# Patient Record
Sex: Female | Born: 1960 | Race: Black or African American | Hispanic: No | Smoking: Never smoker
Health system: Southern US, Community
[De-identification: ages and names within clinical notes are randomized; demographics above are authoritative.]

## PROBLEM LIST (undated history)

## (undated) DIAGNOSIS — I10 Essential (primary) hypertension: Secondary | ICD-10-CM

## (undated) DIAGNOSIS — E119 Type 2 diabetes mellitus without complications: Secondary | ICD-10-CM

## (undated) HISTORY — PX: OOPHORECTOMY: SHX86

---

## 2004-09-29 ENCOUNTER — Ambulatory Visit: Payer: Self-pay | Admitting: Internal Medicine

## 2005-07-28 ENCOUNTER — Emergency Department: Payer: Self-pay | Admitting: Emergency Medicine

## 2005-07-28 ENCOUNTER — Other Ambulatory Visit: Payer: Self-pay

## 2007-09-16 ENCOUNTER — Emergency Department: Payer: Self-pay | Admitting: Emergency Medicine

## 2009-09-25 ENCOUNTER — Ambulatory Visit: Payer: Self-pay

## 2010-01-06 ENCOUNTER — Ambulatory Visit: Payer: Self-pay | Admitting: Family Medicine

## 2011-06-30 ENCOUNTER — Emergency Department: Payer: Self-pay | Admitting: Emergency Medicine

## 2012-01-20 ENCOUNTER — Emergency Department: Payer: Self-pay | Admitting: Emergency Medicine

## 2012-01-20 LAB — COMPREHENSIVE METABOLIC PANEL
Albumin: 3.7 g/dL (ref 3.4–5.0)
Alkaline Phosphatase: 72 U/L (ref 50–136)
BUN: 13 mg/dL (ref 7–18)
Bilirubin,Total: 0.6 mg/dL (ref 0.2–1.0)
Calcium, Total: 9.3 mg/dL (ref 8.5–10.1)
Chloride: 102 mmol/L (ref 98–107)
Co2: 28 mmol/L (ref 21–32)
Creatinine: 0.78 mg/dL (ref 0.60–1.30)
EGFR (African American): >60
EGFR (Non-African Amer.): >60
Glucose: 369 mg/dL — ABNORMAL HIGH (ref 65–99)
Osmolality: 287 (ref 275–301)
SGPT (ALT): 19 U/L
Sodium: 136 mmol/L (ref 136–145)
Total Protein: 7.7 g/dL (ref 6.4–8.2)

## 2012-01-20 LAB — CBC
HGB: 10.8 g/dL — ABNORMAL LOW (ref 12.0–16.0)
MCV: 85 fL (ref 80–100)
Platelet: 236 10*3/uL (ref 150–440)
RBC: 3.89 10*6/uL (ref 3.80–5.20)
WBC: 5.9 10*3/uL (ref 3.6–11.0)

## 2012-11-13 ENCOUNTER — Emergency Department: Payer: Self-pay | Admitting: Internal Medicine

## 2012-11-13 LAB — CBC
HCT: 35.7 % (ref 35.0–47.0)
MCV: 80 fL (ref 80–100)
Platelet: 323 10*3/uL (ref 150–440)
RBC: 4.49 10*6/uL (ref 3.80–5.20)
WBC: 8 10*3/uL (ref 3.6–11.0)

## 2012-11-13 LAB — BASIC METABOLIC PANEL
Anion Gap: 9 (ref 7–16)
BUN: 6 mg/dL — ABNORMAL LOW (ref 7–18)
Co2: 26 mmol/L (ref 21–32)
Creatinine: 0.58 mg/dL — ABNORMAL LOW (ref 0.60–1.30)
EGFR (African American): >60
EGFR (Non-African Amer.): >60
Glucose: 263 mg/dL — ABNORMAL HIGH (ref 65–99)
Potassium: 3.6 mmol/L (ref 3.5–5.1)
Sodium: 137 mmol/L (ref 136–145)

## 2015-02-09 ENCOUNTER — Ambulatory Visit: Payer: Self-pay | Admitting: Family Medicine

## 2015-09-23 ENCOUNTER — Emergency Department: Payer: BC Managed Care – PPO

## 2015-09-23 ENCOUNTER — Encounter: Payer: Self-pay | Admitting: Radiology

## 2015-09-23 ENCOUNTER — Observation Stay
Admission: EM | Admit: 2015-09-23 | Discharge: 2015-09-24 | Disposition: A | Discharge status: Home / Self Care | Payer: BC Managed Care – PPO | Attending: Internal Medicine | Admitting: Internal Medicine

## 2015-09-23 DIAGNOSIS — I071 Rheumatic tricuspid insufficiency: Secondary | ICD-10-CM | POA: Insufficient documentation

## 2015-09-23 DIAGNOSIS — R200 Anesthesia of skin: Secondary | ICD-10-CM | POA: Diagnosis not present

## 2015-09-23 DIAGNOSIS — E669 Obesity, unspecified: Secondary | ICD-10-CM | POA: Insufficient documentation

## 2015-09-23 DIAGNOSIS — Z6829 Body mass index (BMI) 29.0-29.9, adult: Secondary | ICD-10-CM | POA: Diagnosis not present

## 2015-09-23 DIAGNOSIS — R202 Paresthesia of skin: Secondary | ICD-10-CM | POA: Insufficient documentation

## 2015-09-23 DIAGNOSIS — Z79899 Other long term (current) drug therapy: Secondary | ICD-10-CM | POA: Insufficient documentation

## 2015-09-23 DIAGNOSIS — R51 Headache: Secondary | ICD-10-CM | POA: Insufficient documentation

## 2015-09-23 DIAGNOSIS — M4802 Spinal stenosis, cervical region: Secondary | ICD-10-CM | POA: Diagnosis not present

## 2015-09-23 DIAGNOSIS — R29898 Other symptoms and signs involving the musculoskeletal system: Secondary | ICD-10-CM

## 2015-09-23 DIAGNOSIS — R531 Weakness: Secondary | ICD-10-CM | POA: Insufficient documentation

## 2015-09-23 DIAGNOSIS — Z8249 Family history of ischemic heart disease and other diseases of the circulatory system: Secondary | ICD-10-CM | POA: Insufficient documentation

## 2015-09-23 DIAGNOSIS — M5412 Radiculopathy, cervical region: Secondary | ICD-10-CM | POA: Diagnosis not present

## 2015-09-23 DIAGNOSIS — Z7984 Long term (current) use of oral hypoglycemic drugs: Secondary | ICD-10-CM | POA: Insufficient documentation

## 2015-09-23 DIAGNOSIS — I10 Essential (primary) hypertension: Secondary | ICD-10-CM | POA: Diagnosis not present

## 2015-09-23 DIAGNOSIS — I517 Cardiomegaly: Secondary | ICD-10-CM | POA: Diagnosis not present

## 2015-09-23 DIAGNOSIS — M2578 Osteophyte, vertebrae: Secondary | ICD-10-CM | POA: Diagnosis not present

## 2015-09-23 DIAGNOSIS — I639 Cerebral infarction, unspecified: Secondary | ICD-10-CM | POA: Diagnosis present

## 2015-09-23 DIAGNOSIS — G459 Transient cerebral ischemic attack, unspecified: Principal | ICD-10-CM | POA: Insufficient documentation

## 2015-09-23 DIAGNOSIS — E1165 Type 2 diabetes mellitus with hyperglycemia: Secondary | ICD-10-CM | POA: Insufficient documentation

## 2015-09-23 DIAGNOSIS — Z8673 Personal history of transient ischemic attack (TIA), and cerebral infarction without residual deficits: Secondary | ICD-10-CM | POA: Insufficient documentation

## 2015-09-23 DIAGNOSIS — I340 Nonrheumatic mitral (valve) insufficiency: Secondary | ICD-10-CM | POA: Insufficient documentation

## 2015-09-23 HISTORY — DX: Essential (primary) hypertension: I10

## 2015-09-23 HISTORY — DX: Type 2 diabetes mellitus without complications: E11.9

## 2015-09-23 LAB — BASIC METABOLIC PANEL
Anion gap: 10 (ref 5–15)
BUN: 11 mg/dL (ref 6–20)
CALCIUM: 9.1 mg/dL (ref 8.9–10.3)
CO2: 27 mmol/L (ref 22–32)
Chloride: 98 mmol/L — ABNORMAL LOW (ref 101–111)
Creatinine, Ser: 0.72 mg/dL (ref 0.44–1.00)
GFR calc Af Amer: >60 mL/min (ref >60)
Glucose, Bld: 325 mg/dL — ABNORMAL HIGH (ref 65–99)
POTASSIUM: 3.4 mmol/L — AB (ref 3.5–5.1)
SODIUM: 135 mmol/L (ref 135–145)

## 2015-09-23 LAB — URINALYSIS COMPLETE WITH MICROSCOPIC (ARMC ONLY)
BILIRUBIN URINE: NEGATIVE
Bacteria, UA: NONE SEEN
Glucose, UA: >500 mg/dL — AB
HGB URINE DIPSTICK: NEGATIVE
LEUKOCYTES UA: NEGATIVE
NITRITE: NEGATIVE
PH: 6 (ref 5.0–8.0)
Protein, ur: NEGATIVE mg/dL
SPECIFIC GRAVITY, URINE: 1.036 — AB (ref 1.005–1.030)

## 2015-09-23 LAB — CBC
HEMATOCRIT: 36.5 % (ref 35.0–47.0)
Hemoglobin: 12 g/dL (ref 12.0–16.0)
MCH: 29.5 pg (ref 26.0–34.0)
MCHC: 32.9 g/dL (ref 32.0–36.0)
MCV: 89.6 fL (ref 80.0–100.0)
PLATELETS: 258 10*3/uL (ref 150–440)
RBC: 4.08 MIL/uL (ref 3.80–5.20)
RDW: 13.2 % (ref 11.5–14.5)
WBC: 6.8 10*3/uL (ref 3.6–11.0)

## 2015-09-23 LAB — LIPID PANEL
Cholesterol: 170 mg/dL (ref 0–200)
HDL: 47 mg/dL (ref >40)
LDL CALC: 109 mg/dL — AB (ref 0–99)
Total CHOL/HDL Ratio: 3.6 RATIO
Triglycerides: 70 mg/dL (ref <150)
VLDL: 14 mg/dL (ref 0–40)

## 2015-09-23 LAB — GLUCOSE, CAPILLARY: GLUCOSE-CAPILLARY: 241 mg/dL — AB (ref 65–99)

## 2015-09-23 MED ORDER — ASPIRIN EC 81 MG PO TBEC
81.0000 mg | DELAYED_RELEASE_TABLET | Freq: Every day | ORAL | Status: DC
  Administered 2015-09-23 – 2015-09-24 (×2): 81 mg via ORAL
  Filled 2015-09-23 (×2): qty 1

## 2015-09-23 MED ORDER — INSULIN ASPART 100 UNIT/ML ~~LOC~~ SOLN
0.0000 [IU] | Freq: Every day | SUBCUTANEOUS | Status: DC
  Administered 2015-09-24: 2 [IU] via SUBCUTANEOUS
  Filled 2015-09-23: qty 2

## 2015-09-23 MED ORDER — OXYCODONE HCL 5 MG PO TABS
5.0000 mg | ORAL_TABLET | ORAL | Status: DC | PRN
  Administered 2015-09-24 (×3): 5 mg via ORAL
  Filled 2015-09-23 (×3): qty 1

## 2015-09-23 MED ORDER — SODIUM CHLORIDE 0.9 % IJ SOLN
3.0000 mL | Freq: Two times a day (BID) | INTRAMUSCULAR | Status: DC
  Administered 2015-09-23: 3 mL via INTRAVENOUS

## 2015-09-23 MED ORDER — MORPHINE SULFATE (PF) 2 MG/ML IV SOLN: 2.0000 mg | INTRAVENOUS | Status: DC | PRN

## 2015-09-23 MED ORDER — SODIUM CHLORIDE 0.9 % IV SOLN
INTRAVENOUS | Status: DC
  Administered 2015-09-24 (×2): via INTRAVENOUS

## 2015-09-23 MED ORDER — INSULIN ASPART 100 UNIT/ML ~~LOC~~ SOLN
0.0000 [IU] | Freq: Three times a day (TID) | SUBCUTANEOUS | Status: DC
  Administered 2015-09-24 (×2): 5 [IU] via SUBCUTANEOUS
  Filled 2015-09-23 (×2): qty 5

## 2015-09-23 MED ORDER — ACETAMINOPHEN 650 MG RE SUPP
650.0000 mg | Freq: Four times a day (QID) | RECTAL | Status: DC | PRN
Start: 2015-09-23 — End: 2015-09-24

## 2015-09-23 MED ORDER — ACETAMINOPHEN 325 MG PO TABS
650.0000 mg | ORAL_TABLET | Freq: Four times a day (QID) | ORAL | Status: DC | PRN
  Administered 2015-09-23: 650 mg via ORAL
  Filled 2015-09-23: qty 2

## 2015-09-23 NOTE — H&P (Signed)
[facility] - [place] at [facility]   PATIENT NAME: Ashley Spears    MR#:  [id number]  DATE OF BIRTH:  04-Oct-1961   DATE OF ADMISSION:  09/23/2015  PRIMARY CARE PHYSICIAN: Dorann Ou, MD   REQUESTING/REFERRING PHYSICIAN: Shaune Pollack  CHIEF COMPLAINT:   Chief Complaint  Patient presents with  . Numbness   left arm numbness  HISTORY OF PRESENT ILLNESS:  Alleya Demeter  is a 54 y.o. female with a known history of essential hypertension, type 2 diabetes non-insulin-requiring is presenting with left upper extremity numbness. She describes her to the one day duration of left upper extremity numbness with paresthesias. She also associated headache which she describes mainly has global headache aching in quality his 4/10 intensity no worsening or relieving factors not associated with bright lights. She denies any further symptomatology however given duration of symptoms she is at present to Hospital further workup and evaluation. Upon presentation to emergency department she was noted to be hypertensive as well as had left-sided weakness.  PAST MEDICAL HISTORY:   Past Medical History  Diagnosis Date  . Hypertension   . Diabetes mellitus without complication (HCC)     PAST SURGICAL HISTORY:  History reviewed. No pertinent past surgical history.  SOCIAL HISTORY:   Social History  Substance Use Topics  . Smoking status: Never Smoker   . Smokeless tobacco: Not on file  . Alcohol Use: No    FAMILY HISTORY:   Family History  Problem Relation Age of Onset  . Hypertension Other     DRUG ALLERGIES:  No Known Allergies  REVIEW OF SYSTEMS:  REVIEW OF SYSTEMS:  CONSTITUTIONAL: Denies fevers, chills, fatigue, weakness.  EYES: Denies blurred vision, double vision, or eye pain.  EARS, NOSE, THROAT: Denies tinnitus, ear pain, hearing loss.  RESPIRATORY: denies cough, shortness of breath, wheezing  CARDIOVASCULAR: Denies chest pain, palpitations, edema.   GASTROINTESTINAL: Denies nausea, vomiting, diarrhea, abdominal pain.  GENITOURINARY: Denies dysuria, hematuria.  ENDOCRINE: Denies nocturia or thyroid problems. HEMATOLOGIC AND LYMPHATIC: Denies easy bruising or bleeding.  SKIN: Denies rash or lesions.  MUSCULOSKELETAL: Denies pain in neck, back, shoulder, knees, hips, or further arthritic symptoms.  NEUROLOGIC: Denies paralysis, positive paresthesias and headache.  PSYCHIATRIC: Denies anxiety or depressive symptoms. Otherwise full review of systems performed by me is negative.   MEDICATIONS AT HOME:   Prior to Admission medications   Medication Sig Start Date End Date Taking? Authorizing Provider  ibuprofen (ADVIL,MOTRIN) 800 MG tablet Take 800 mg by mouth every 8 (eight) hours as needed for mild pain.   Yes Historical Provider, MD  lisinopril-hydrochlorothiazide (PRINZIDE,ZESTORETIC) 20-25 MG tablet Take 1 tablet by mouth daily.   Yes Historical Provider, MD  metFORMIN (GLUCOPHAGE) 1000 MG tablet Take 1,000 mg by mouth 2 (two) times daily.   Yes Historical Provider, MD  norethindrone (AYGESTIN) 5 MG tablet Take 5 mg by mouth daily.   Yes Historical Provider, MD      VITAL SIGNS:  Blood pressure 171/97, pulse 93, temperature 98.7 F (37.1 C), temperature source Oral, resp. rate 16, last menstrual period 09/04/2015, SpO2 100 %.  PHYSICAL EXAMINATION:  VITAL SIGNS: Filed Vitals:   09/23/15 2130  BP: 171/97  Pulse: 93  Temp:   Resp: 16   GENERAL:54 y.o.female currently in no acute distress.  HEAD: Normocephalic, atraumatic.  EYES: Pupils equal, round, reactive to light. Extraocular muscles intact. No scleral icterus.  MOUTH: Moist mucosal membrane. Dentition intact. No abscess noted.  EAR, NOSE, THROAT: Clear  without exudates. No external lesions.  NECK: Supple. No thyromegaly. No nodules. No JVD.  PULMONARY: Clear to ascultation, without wheeze rails or rhonci. No use of accessory muscles, Good respiratory effort. good air  entry bilaterally CHEST: Nontender to palpation.  CARDIOVASCULAR: S1 and S2. Regular rate and rhythm. No murmurs, rubs, or gallops. No edema. Pedal pulses 2+ bilaterally.  GASTROINTESTINAL: Soft, nontender, nondistended. No masses. Positive bowel sounds. No hepatosplenomegaly.  MUSCULOSKELETAL: No swelling, clubbing, or edema. Range of motion full in all extremities.  NEUROLOGIC: Cranial nerves II through XII are intact. Sensation to light touch diminished left upper extremity remainder intact, strength 5/5 right-sided upper/lower proximal/distal flexion/extension, left handgrip 4 minus/5 left upper extremity flexion/extension proximal/distal 4 minus/5 lower extremity proximal flexion and extension 4+/5 distal flexion 4-/5 pronator drift within normal limits, gait deferred at this time positive left sided cervical radiculopathy (Spurling maneuver) SKIN: No ulceration, lesions, rashes, or cyanosis. Skin warm and dry. Turgor intact.  PSYCHIATRIC: Mood, affect within normal limits. The patient is awake, alert and oriented x 3. Insight, judgment intact.    LABORATORY PANEL:   CBC  Recent Labs Lab 09/23/15 2012  WBC 6.8  HGB 12.0  HCT 36.5  PLT 258   ------------------------------------------------------------------------------------------------------------------  Chemistries   Recent Labs Lab 09/23/15 2012  NA 135  K 3.4*  CL 98*  CO2 27  GLUCOSE 325*  BUN 11  CREATININE 0.72  CALCIUM 9.1   ------------------------------------------------------------------------------------------------------------------  Cardiac Enzymes No results for input(s): TROPONINI in the last 168 hours. ------------------------------------------------------------------------------------------------------------------  RADIOLOGY:  Ct Head Wo Contrast  09/23/2015  CLINICAL DATA:  Patient woke up yesterday morning with left arm numbness and tingling associated with headache. No resolution. Generalized  weakness. Patient was sent here from [facility] primary care. EXAM: CT HEAD WITHOUT CONTRAST TECHNIQUE: Contiguous axial images were obtained from the base of the skull through the vertex without intravenous contrast. COMPARISON:  11/13/2012 FINDINGS: The ventricles and sulci appear symmetrical. No ventricular dilatation. No mass effect or midline shift. No abnormal extra-axial fluid collections. Gray-white matter junctions are distinct. Basal cisterns are not effaced. No evidence of acute intracranial hemorrhage. No depressed skull fractures. Visualized paranasal sinuses and mastoid air cells are not opacified. IMPRESSION: No acute intracranial abnormalities. Electronically Signed   By: Burman NievesWilliam  Stevens M.D.   On: 09/23/2015 20:08    EKG:   Orders placed or performed during the hospital encounter of 09/23/15  . ED EKG  . ED EKG    IMPRESSION AND PLAN:   54 year old African-American female history of essential hypertension is presenting with left upper extremity numbness  1. CVA: Given exam concerning for stroke initiate aspirin statin therapy check MRI, placed in telemetry, check lipids, hemoglobin A1c neuro checks, permissive hypertension. Given cervical radiculopathy will also get MRI cervical spine 2. Essential hypertension: Permissive hypertension 3. Type 2 diabetes not as requiring: Hold oral agents at insulin sliding scale with Accu-Cheks 4. Venous thromboembolism prophylactic: SCDs first 24 hours can transition to heparin after that    All the records are reviewed and case discussed with ED provider. Management plans discussed with the patient, family and they are in agreement.  CODE STATUS: Full  TOTAL TIME TAKING CARE OF THIS PATIENT: 45 minutes.    Amilliana Hayworth,  Mardi MainlandDavid K M.D on 09/23/2015 at 10:03 PM  Between 7am to 6pm - Pager - [phone]  After 6pm: House Pager: - [phone]  [facility]  Office  [phone]  CC: Primary care physician; Dorann OuGEORGE, SAIRA,  MD
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
## 2015-09-23 NOTE — ED Notes (Signed)
Pt presents to via EMS from [facility] primary care with c/o left arm numbness and tingling that she woke with yesterday morning, associated with headache. Pt states numbness and tinglings has not resolved since. Pt also reports generalize weakness. BP-190/107, CBG-375. Pt states she has been under stress lately.

## 2015-09-23 NOTE — ED Notes (Signed)
Report attempted 

## 2015-09-24 ENCOUNTER — Observation Stay: Payer: BC Managed Care – PPO

## 2015-09-24 ENCOUNTER — Observation Stay
Admit: 2015-09-24 | Discharge: 2015-09-24 | Disposition: A | Discharge status: Home / Self Care | Payer: BC Managed Care – PPO | Attending: Internal Medicine | Admitting: Internal Medicine

## 2015-09-24 LAB — HEMOGLOBIN A1C: HEMOGLOBIN A1C: 13.9 % — AB (ref 4.0–6.0)

## 2015-09-24 LAB — GLUCOSE, CAPILLARY
GLUCOSE-CAPILLARY: 255 mg/dL — AB (ref 65–99)
Glucose-Capillary: 254 mg/dL — ABNORMAL HIGH (ref 65–99)

## 2015-09-24 MED ORDER — SIMVASTATIN 10 MG PO TABS: 10.0000 mg | ORAL_TABLET | Freq: Every day | ORAL | Status: DC

## 2015-09-24 MED ORDER — GABAPENTIN 100 MG PO CAPS: 100.0000 mg | ORAL_CAPSULE | Freq: Three times a day (TID) | ORAL | Status: DC

## 2015-09-24 MED ORDER — LIVING WELL WITH DIABETES BOOK
Freq: Once | Status: AC
  Administered 2015-09-24: 19:00:00
  Filled 2015-09-24: qty 1

## 2015-09-24 MED ORDER — ATORVASTATIN CALCIUM 20 MG PO TABS: 20.0000 mg | ORAL_TABLET | Freq: Every day | ORAL | Status: AC

## 2015-09-24 MED ORDER — ASPIRIN 81 MG PO TBEC: 81.0000 mg | DELAYED_RELEASE_TABLET | Freq: Every day | ORAL | Status: DC

## 2015-09-24 NOTE — Plan of Care (Addendum)
Individualization: Patient is from home with spouse Hx of HTN, DM, continue on home medications. Moderate Fall Risk   Problem: Education: Goal: Knowledge of [facility] Education information/materials will improve Outcome: Progressing Pt educated on safety, encouraged to call for assistance when needed. Pt educated on stroke protocol and what to expect  Problem: Safety: Goal: Ability to remain free from injury will improve Outcome: Progressing Pt is a Moderate Fall, encouraged to call for assistance when needed.  Problem: Pain Managment: Goal: General experience of comfort will improve Patient c/o headache, Oxycodone given with improvement. Continue to assess .

## 2015-09-24 NOTE — Progress Notes (Signed)
*  PRELIMINARY RESULTS* Echocardiogram 2D Echocardiogram has been performed.  Georgann HousekeeperJerry R Hege 09/24/2015, 8:52 AM

## 2015-09-24 NOTE — Progress Notes (Signed)
[facility] Physicians - [place] at [facility]   PATIENT NAME: Ashley Spears    MR#:  [id number]  DATE OF BIRTH:  Mar 03, 1961  SUBJECTIVE: 54 year old female patient with hypertension, diabetes, admitted for left upper extremity numbness, weakness of the left upper and lower extremity. Patient had a head CAT scan which is negative. But MRI brain is not done because MRA machine broke.numbness in the left arm.   CHIEF COMPLAINT:   Chief Complaint  Patient presents with  . Numbness    REVIEW OF SYSTEMS:    Review of Systems  Constitutional: Negative for fever and chills.  HENT: Negative for hearing loss.   Eyes: Negative for blurred vision, double vision and photophobia.  Respiratory: Negative for cough, hemoptysis and shortness of breath.   Cardiovascular: Negative for palpitations, orthopnea and leg swelling.  Gastrointestinal: Negative for vomiting, abdominal pain and diarrhea.  Genitourinary: Negative for dysuria and urgency.  Musculoskeletal: Negative for myalgias and neck pain.  Skin: Negative for rash.  Neurological: Positive for sensory change and focal weakness. Negative for dizziness, seizures, weakness and headaches.  Psychiatric/Behavioral: Negative for memory loss. The patient does not have insomnia.     Nutrition: Tolerating Diet: Tolerating PT:      DRUG ALLERGIES:  No Known Allergies  VITALS:  Blood pressure 149/84, pulse 80, temperature 97.8 F (36.6 C), temperature source Oral, resp. rate 12, height  (1.575 m), weight 72.349 kg (159 lb 8 oz), last menstrual period 09/04/2015, SpO2 98 %.  PHYSICAL EXAMINATION:   Physical Exam  GENERAL:  54 y.o.-year-old patient lying in the bed with no acute distress.  EYES: Pupils equal, round, reactive to light and accommodation. No scleral icterus. Extraocular muscles intact.  HEENT: Head atraumatic, normocephalic. Oropharynx and nasopharynx clear.  NECK:  Supple, no jugular venous distention. No  thyroid enlargement, no tenderness.  LUNGS: Normal breath sounds bilaterally, no wheezing, rales,rhonchi or crepitation. No use of accessory muscles of respiration.  CARDIOVASCULAR: S1, S2 normal. No murmurs, rubs, or gallops.  ABDOMEN: Soft, nontender, nondistended. Bowel sounds present. No organomegaly or mass.  EXTREMITIES: No pedal edema, cyanosis, or clubbing.  NEUROLOGIC: Cranial nerves II through XII are intact. Muscle strength 5/5 in right side, left upper and lower extremity decreased power around 3 out of 5. Sensation intact. Gait not checked. Numbness in the left arm radiating from prior to down. PSYCHIATRIC: The patient is alert and oriented x 3.  SKIN: No obvious rash, lesion, or ulcer.    LABORATORY PANEL:   CBC  Recent Labs Lab 09/23/15 2012  WBC 6.8  HGB 12.0  HCT 36.5  PLT 258   ------------------------------------------------------------------------------------------------------------------  Chemistries   Recent Labs Lab 09/23/15 2012  NA 135  K 3.4*  CL 98*  CO2 27  GLUCOSE 325*  BUN 11  CREATININE 0.72  CALCIUM 9.1   ------------------------------------------------------------------------------------------------------------------  Cardiac Enzymes No results for input(s): TROPONINI in the last 168 hours. ------------------------------------------------------------------------------------------------------------------  RADIOLOGY:  Ct Head Wo Contrast  09/23/2015  CLINICAL DATA:  Patient woke up yesterday morning with left arm numbness and tingling associated with headache. No resolution. Generalized weakness. Patient was sent here from [facility] primary care. EXAM: CT HEAD WITHOUT CONTRAST TECHNIQUE: Contiguous axial images were obtained from the base of the skull through the vertex without intravenous contrast. COMPARISON:  11/13/2012 FINDINGS: The ventricles and sulci appear symmetrical. No ventricular dilatation. No mass effect or midline shift. No  abnormal extra-axial fluid collections. Gray-white matter junctions are distinct. Basal cisterns are not  effaced. No evidence of acute intracranial hemorrhage. No depressed skull fractures. Visualized paranasal sinuses and mastoid air cells are not opacified. IMPRESSION: No acute intracranial abnormalities. Electronically Signed   By: Burman NievesWilliam  Stevens M.D.   On: 09/23/2015 20:08   [place] Carotid Bilateral  09/24/2015  CLINICAL DATA:  54 year old female with a history of cerebral vascular accident. Cardiovascular risk factors include hypertension, diabetes. EXAM: BILATERAL CAROTID DUPLEX ULTRASOUND TECHNIQUE: Wallace CullensGray scale imaging, color Doppler and duplex ultrasound were performed of bilateral carotid and vertebral arteries in the neck. COMPARISON:  Carotid report 09/05/2002.  No images FINDINGS: Criteria: Quantification of carotid stenosis is based on velocity parameters that correlate the residual internal carotid diameter with NASCET-based stenosis levels, using the diameter of the distal internal carotid lumen as the denominator for stenosis measurement. The following velocity measurements were obtained: RIGHT ICA:  Systolic 88 cm/sec, Diastolic 29 cm/sec CCA:  92 cm/sec SYSTOLIC ICA/CCA RATIO:  1.0 ECA:  81 cm/sec LEFT ICA:  Systolic 85 cm/sec, Diastolic 35 cm/sec CCA:  80 cm/sec SYSTOLIC ICA/CCA RATIO:  1.1 ECA:  76 cm/sec Right Brachial SBP: Not acquired Left Brachial SBP: Not acquired RIGHT CAROTID ARTERY: Unremarkable appearance of the carotid system without significant atherosclerotic disease. Intermediate waveform of the CCA. Low resistance waveform of the ICA. No significant tortuosity of the carotid system. RIGHT VERTEBRAL ARTERY: Antegrade flow with low resistance waveform. LEFT CAROTID ARTERY: Unremarkable appearance of the carotid system without significant atherosclerotic disease. Intermediate waveform of the CCA. Low resistance waveform of the ICA. No significant tortuosity of the carotid system. LEFT  VERTEBRAL ARTERY:  Antegrade flow with low resistance waveform. IMPRESSION: Color duplex indicates no significant plaque, with no hemodynamically significant stenosis by duplex criteria in the extracranial cerebrovascular circulation. Signed, Yvone NeuJaime S. Loreta AveWagner, DO [facility] Electronically Signed   By: Gilmer MorJaime  Wagner D.O.   On: 09/24/2015 09:37     ASSESSMENT AND PLAN:   Principal Problem:   TIA (transient ischemic attack) Active Problems:   CVA (cerebral infarction)   1. Left sided weakness, left arm numbness evaluate for TIA: MRI of the brain is pending, follow her echocardiogram. Carotid ultrasound is normal. Continue aspirin, LDL is more than 100 started on statins. 2 diabetes mellitus type 2 check hemoglobin A1c: Seen by a diabetic patient, recommended to start Levemir,continue  metformin  All the records are reviewed and case discussed with Care Management/Social Workerr. Management plans discussed with the patient, family and they are in agreement.  CODE STATUS: full  TOTAL TIME TAKING CARE OF THIS PATIENT: 35 minutes.   POSSIBLE D/C IN 1-2 DAYS, DEPENDING ON CLINICAL CONDITION.   Katha HammingKONIDENA,Katena Petitjean M.D on 09/24/2015 at 12:51 PM  Between 7am to 6pm - Pager - [phone]  After 6pm go to [email or web address] - password [email or web address] [facility]  [facility]  Office  [phone]  CC: Primary care physician; Dorann OuGEORGE, SAIRA, MD

## 2015-09-24 NOTE — Progress Notes (Signed)
Discharge home with mother attendance. States she has her discharge papers with no further needs.

## 2015-09-24 NOTE — Progress Notes (Signed)
Inpatient Diabetes Program Recommendations  AACE/ADA: New Consensus Statement on Inpatient Glycemic Control (2015)  Target Ranges:  Prepandial:   less than 140 mg/dL      Peak postprandial:   less than 180 mg/dL (1-2 hours)      Critically ill patients:  140 - 180 mg/dL   Review of Glycemic Control:  Results for Ashley Spears, Ashley Spears (MRN [id number]) as of 09/24/2015 12:22  Ref. Range 09/23/2015 23:24 09/24/2015 10:21  Glucose-Capillary Latest Ref Range: 65-99 mg/dL 528241 (H) 413254 (H)  Note that per Chart review, last A1C was done on 01/29/15.  Her A1C at that time was 12.8%.  Diabetes history: Type 2 diabetes Outpatient Diabetes medications: Metformin 1000 mg bid Current orders for Inpatient glycemic control:   Inpatient Diabetes Program Recommendations:    A1C pending but based on admission CBG's and past A1C, it appears that patient would benefit from the start of insulin.  Please consider adding Levemir 15 units daily (0.2 units/kg) and Novolog 4 units tid with meals (hold if patient eats less than 50%).    Thanks, Beryl MeagerJenny Clarinda Obi, RN, BC-ADM Inpatient Diabetes Coordinator Pager [phone] (8a-5p)

## 2015-09-24 NOTE — Progress Notes (Signed)
Spoke to patient regarding diabetes management.  She recalls that last A1C was greater than 8.0%.  She states that she was only taking Metformin prior to admit.  Explained that past A1C in March=12.8% however A1C is currently pending.  Discussed potential need for insulin based on previous A1C.  [facility]  Encouraged follow-up with PCP regarding uncontrolled diabetes and discussed goal blood sugars.  Will follow.  Thanks, Beryl MeagerJenny Giliana Vantil, RN, BC-ADM Inpatient Diabetes Coordinator Pager [phone] (8a-5p)

## 2015-09-24 NOTE — Discharge Summary (Signed)
Ashley Spears, is a 54 y.o. female  DOB 11-22-1960  MRN [id number].  Admission date:  09/23/2015  Admitting Physician  Wyatt Haste, MD  Discharge Date:  09/24/2015   Primary MD  Dorann Ou, MD  Recommendations for primary care physician for things to follow:  Follow up with PMD in one week ,needs out pt PT eval for left arm numbness/cervical stenosis/disc bulge on Left   Admission Diagnosis  Left leg weakness [R29.898] Left arm weakness [R29.898] Arm paresthesia, left [R20.2]   Discharge Diagnosis  Left leg weakness [R29.898] Left arm weakness [R29.898] Arm paresthesia, left [R20.2]    Principal Problem:   TIA (transient ischemic attack) Active Problems:   CVA (cerebral infarction)      Past Medical History  Diagnosis Date  . Hypertension   . Diabetes mellitus without complication (HCC)     History reviewed. No pertinent past surgical history.     History of present illness and  Hospital Course:     Kindly see H&P for history of present illness and admission details, please review complete Labs, Consult reports and Test reports for all details in brief  HPI  from the history and physical done on the day of admission  54 yr old obese female with HTN,poorly controlled DMII,comes in with left arm numbness/left sided weakness.admitted for TIA eval.  Hospital Course   1.Left arm numbness/left side weakness;pt CT head.MRI brain did not show acute stroke.admitted to Obs status for TIA/CVA eval.carotid ultrasound did not show any hemodynamically significant plaque.Echo showed EF more than 55%.LDL more than 100 so started on Statins.advised her to start statins and ASA at discharge  2.LEft arm numbness;MRI cervical spine showed left side foraminal narrowing and osteophytes.started on Neurontin.advised her  to lose weight and be cautious in lifting weights more than 5 lbs.she needs to have strict control of her DMII and HTN.her PMD can refer her for PT to help her with  lkeft arm numbness.she cna continue low dose Neurontin.  3.DMII;seen by diabetes RN.hba1c is 13.9.spoke to pt to follow up with PCP regarding DMII control and possibly starting basal and meal time insulin.she can continue metformin and follow up with PCP> 4.HTN;continue home meds.   Discharge Condition:stable   Follow UP  Follow-up Information    Follow up with Dorann Ou, MD In 1 week.   Specialty:  Urgent Care   Contact information:   [address] [facility] [address] [phone]         Discharge Instructions  and  Discharge Medications       Medication List    TAKE these medications        aspirin 81 MG EC tablet  Take 1 tablet (81 mg total) by mouth daily.     atorvastatin 20 MG tablet  Commonly known as:  LIPITOR  Take 1 tablet (20 mg total) by mouth daily.     gabapentin 100 MG capsule  Commonly known as:  NEURONTIN  Take 1 capsule (100 mg total) by mouth 3 (three) times daily.     ibuprofen 800 MG tablet  Commonly known as:  ADVIL,MOTRIN  Take 800 mg by mouth every 8 (eight) hours as needed for mild pain.     lisinopril-hydrochlorothiazide 20-25 MG tablet  Commonly known as:  PRINZIDE,ZESTORETIC  Take 1 tablet by mouth daily.     metFORMIN 1000 MG tablet  Commonly known as:  GLUCOPHAGE  Take 1,000 mg by mouth 2 (two) times  daily.     norethindrone 5 MG tablet  Commonly known as:  AYGESTIN  Take 5 mg by mouth daily.          Diet and Activity recommendation: See Discharge Instructions above   Consults obtained - none   Major procedures and Radiology Reports - PLEASE review detailed and final reports for all details, in brief -     Ct Head Wo Contrast  09/23/2015  CLINICAL DATA:  Patient woke up yesterday morning with left arm numbness and tingling  associated with headache. No resolution. Generalized weakness. Patient was sent here from [facility]. EXAM: CT HEAD WITHOUT CONTRAST TECHNIQUE: Contiguous axial images were obtained from the base of the skull through the vertex without intravenous contrast. COMPARISON:  11/13/2012 FINDINGS: The ventricles and sulci appear symmetrical. No ventricular dilatation. No mass effect or midline shift. No abnormal extra-axial fluid collections. Gray-white matter junctions are distinct. Basal cisterns are not effaced. No evidence of acute intracranial hemorrhage. No depressed skull fractures. Visualized paranasal sinuses and mastoid air cells are not opacified. IMPRESSION: No acute intracranial abnormalities. Electronically Signed   By: Burman NievesWilliam  Stevens M.D.   On: 09/23/2015 20:08   Mr Brain Wo Contrast  09/24/2015  CLINICAL DATA:  54 year old female with history of essential hypertension, type 2 diabetes non-insulin-requiring is presenting with left upper extremity numbness.She describes one day duration of left upper extremity numbness with paresthesias. She also associated headache which she describes mainly has global headache aching in quality, 4/10 intensity. She denies any further symptomatology. Upon presentation to emergency department she was noted to be hypertensive as well as had left-sided weakness. EXAM: MRI HEAD WITHOUT CONTRAST TECHNIQUE: Multiplanar, multiecho pulse sequences of the brain and surrounding structures were obtained without intravenous contrast. COMPARISON:  CT head 09/23/2015. MRI cervical spine reported separately. FINDINGS: The patient was unable to remain motionless for the exam. Small or subtle lesions could be overlooked. No acute stroke or hemorrhage. No mass lesion or hydrocephalus. No extra-axial fluid. Normal for age cerebral volume. Minor white matter disease, nonspecific. Flow voids are maintained throughout the carotid, basilar, and vertebral arteries. There are no areas of  chronic hemorrhage. Moderate dolichoectasia reflecting hypertension. No visible chronic hemorrhage. No signs of PRES. Prominent perivascular spaces. Normal pituitary and cerebellar tonsils. Extracranial soft tissues unremarkable. IMPRESSION: Motion degraded exam demonstrating no acute intracranial findings. Please see MRI cervical spine dictation for further discussion regarding possible compressive lesion affecting the cord and LEFT upper extremity. Electronically Signed   By: Elsie StainJohn T Curnes M.D.   On: 09/24/2015 16:56   Mr Cervical Spine Wo Contrast  09/24/2015  CLINICAL DATA:  New onset of left upper extremity numbness with paresthesias. Global headache 4/10. At representation, the patient was hypertensive with left-sided weakness. EXAM: MRI CERVICAL SPINE WITHOUT CONTRAST TECHNIQUE: Multiplanar, multisequence MR imaging of the cervical spine was performed. No intravenous contrast was administered. COMPARISON:  None. FINDINGS: Normal signal is present in the cervical and upper thoracic spinal cord to the lowest imaged level, T2-3. Marrow signal, vertebral the heights, and alignment are normal. The craniocervical junction is within normal limits. The visualized intracranial contents are normal. Flow is present in the vertebral arteries. C2-3:  Negative. C3-4: A shallow central disc protrusion is present. There is no significant stenosis. C4-5: A prominent leftward disc osteophyte complex is present. There is contact and distortion of left ventral aspect of the cord. The canal is narrowed to 8.5 mm. Mild to moderate foraminal narrowing is worse on the left.  C5-6: There leftward disc osteophyte complex narrows the canal to 6.5 mm. Mild to moderate left and mild right foraminal narrowing is present. C6-7: A rightward disc osteophyte complex is present. Mild right foraminal narrowing is present. The central canal and left foramen are patent. C7-T1:  Negative. IMPRESSION: 1. Moderate left central and mild to moderate  bilateral foraminal narrowing, worse on the left, at C4-5 secondary to prominent leftward disc osteophyte complex and facet hypertrophy. 2. Leftward disc osteophyte complex with moderate central, mild to moderate left, and mild right foraminal stenosis at C5-6. 3. Minimal right foraminal narrowing at C6-7. 4. Shallow central disc protrusion at C3-4 without significant stenosis. Electronically Signed   By: Marin Roberts M.D.   On: 09/24/2015 16:42   US Carotid Bilateral  09/24/2015  CLINICAL DATA:  54 year old female with a history of cerebral vascular accident. Cardiovascular risk factors include hypertension, diabetes. EXAM: BILATERAL CAROTID DUPLEX ULTRASOUND TECHNIQUE: Wallace Cullens scale imaging, color Doppler and duplex ultrasound were performed of bilateral carotid and vertebral arteries in the neck. COMPARISON:  Carotid report 09/05/2002.  No images FINDINGS: Criteria: Quantification of carotid stenosis is based on velocity parameters that correlate the residual internal carotid diameter with NASCET-based stenosis levels, using the diameter of the distal internal carotid lumen as the denominator for stenosis measurement. The following velocity measurements were obtained: RIGHT ICA:  Systolic 88 cm/sec, Diastolic 29 cm/sec CCA:  92 cm/sec SYSTOLIC ICA/CCA RATIO:  1.0 ECA:  81 cm/sec LEFT ICA:  Systolic 85 cm/sec, Diastolic 35 cm/sec CCA:  80 cm/sec SYSTOLIC ICA/CCA RATIO:  1.1 ECA:  76 cm/sec Right Brachial SBP: Not acquired Left Brachial SBP: Not acquired RIGHT CAROTID ARTERY: Unremarkable appearance of the carotid system without significant atherosclerotic disease. Intermediate waveform of the CCA. Low resistance waveform of the ICA. No significant tortuosity of the carotid system. RIGHT VERTEBRAL ARTERY: Antegrade flow with low resistance waveform. LEFT CAROTID ARTERY: Unremarkable appearance of the carotid system without significant atherosclerotic disease. Intermediate waveform of the CCA. Low resistance  waveform of the ICA. No significant tortuosity of the carotid system. LEFT VERTEBRAL ARTERY:  Antegrade flow with low resistance waveform. IMPRESSION: Color duplex indicates no significant plaque, with no hemodynamically significant stenosis by duplex criteria in the extracranial cerebrovascular circulation. Signed, Yvone Neu. Loreta Ave, DO [facility] Electronically Signed   By: Gilmer Mor D.O.   On: 09/24/2015 09:37    Micro Results   No results found for this or any previous visit (from the past 240 hour(s)).     Today   Subjective:   Ashley Spears today has no headache,no chest abdominal pain,no new weakness tingling or numbness, feels much better wants to go home today.   Objective:   Blood pressure 149/86, pulse 81, temperature 98.6 F (37 C), temperature source Oral, resp. rate 12, height  (1.575 m), weight 72.349 kg (159 lb 8 oz), last menstrual period 09/04/2015, SpO2 98 %.  No intake or output data in the 24 hours ending 09/24/15 1742  Exam Awake Alert, Oriented x 3, No new F.N deficits, Normal affect [place].AT,PERRAL Supple Neck,No JVD, No cervical lymphadenopathy appriciated.  Symmetrical Chest wall movement, Good air movement bilaterally, CTAB RRR,No Gallops,Rubs or new Murmurs, No Parasternal Heave +ve B.Sounds, Abd Soft, Non tender, No organomegaly appriciated, No rebound -guarding or rigidity. No Cyanosis, Clubbing or edema, No new Rash or bruise  Data Review   CBC w Diff:  Lab Results  Component Value Date   WBC 6.8 09/23/2015  WBC 8.0 11/13/2012   HGB 12.0 09/23/2015   HGB 11.3* 11/13/2012   HCT 36.5 09/23/2015   HCT 35.7 11/13/2012   PLT 258 09/23/2015   PLT 323 11/13/2012    CMP:  Lab Results  Component Value Date   NA 135 09/23/2015   NA 137 11/13/2012   K 3.4* 09/23/2015   K 3.6 11/13/2012   CL 98* 09/23/2015   CL 102 11/13/2012   CO2 27 09/23/2015   CO2 26 11/13/2012   BUN 11  09/23/2015   BUN 6* 11/13/2012   CREATININE 0.72 09/23/2015   CREATININE 0.58* 11/13/2012   PROT 7.7 01/20/2012   ALBUMIN 3.7 01/20/2012   BILITOT 0.6 01/20/2012   ALKPHOS 72 01/20/2012   AST 16 01/20/2012   ALT 19 01/20/2012  .   Total Time in preparing paper work, data evaluation and todays exam - 35 minutes  Lanai Conlee M.D on 09/24/2015 at 5:42 PM    Note: This dictation was prepared with Dragon dictation along with smaller phrase technology. Any transcriptional errors that result from this process are unintentional.

## 2016-08-17 ENCOUNTER — Ambulatory Visit: Payer: Self-pay

## 2016-08-17 ENCOUNTER — Other Ambulatory Visit: Payer: Self-pay | Admitting: Family Medicine

## 2016-08-17 ENCOUNTER — Ambulatory Visit
Admission: RE | Admit: 2016-08-17 | Discharge: 2016-08-17 | Disposition: A | Discharge status: Home / Self Care | Payer: BC Managed Care – PPO | Source: Ambulatory Visit | Attending: Family Medicine | Admitting: Family Medicine

## 2016-08-17 DIAGNOSIS — R600 Localized edema: Secondary | ICD-10-CM

## 2018-01-16 ENCOUNTER — Emergency Department: Payer: BC Managed Care – PPO

## 2018-01-16 ENCOUNTER — Emergency Department
Admission: EM | Admit: 2018-01-16 | Discharge: 2018-01-16 | Disposition: A | Discharge status: Home / Self Care | Payer: BC Managed Care – PPO | Attending: Emergency Medicine | Admitting: Emergency Medicine

## 2018-01-16 ENCOUNTER — Other Ambulatory Visit: Payer: Self-pay

## 2018-01-16 DIAGNOSIS — Z7982 Long term (current) use of aspirin: Secondary | ICD-10-CM | POA: Insufficient documentation

## 2018-01-16 DIAGNOSIS — I10 Essential (primary) hypertension: Secondary | ICD-10-CM | POA: Diagnosis not present

## 2018-01-16 DIAGNOSIS — W57XXXA Bitten or stung by nonvenomous insect and other nonvenomous arthropods, initial encounter: Secondary | ICD-10-CM | POA: Diagnosis not present

## 2018-01-16 DIAGNOSIS — Z7984 Long term (current) use of oral hypoglycemic drugs: Secondary | ICD-10-CM | POA: Diagnosis not present

## 2018-01-16 DIAGNOSIS — R079 Chest pain, unspecified: Secondary | ICD-10-CM | POA: Insufficient documentation

## 2018-01-16 DIAGNOSIS — Z79899 Other long term (current) drug therapy: Secondary | ICD-10-CM | POA: Diagnosis not present

## 2018-01-16 DIAGNOSIS — E119 Type 2 diabetes mellitus without complications: Secondary | ICD-10-CM | POA: Diagnosis not present

## 2018-01-16 LAB — BASIC METABOLIC PANEL
Anion gap: 10 (ref 5–15)
BUN: 12 mg/dL (ref 6–20)
CHLORIDE: 100 mmol/L — AB (ref 101–111)
CO2: 25 mmol/L (ref 22–32)
CREATININE: 0.65 mg/dL (ref 0.44–1.00)
Calcium: 8.9 mg/dL (ref 8.9–10.3)
GFR calc Af Amer: >60 mL/min (ref >60)
GFR calc non Af Amer: >60 mL/min (ref >60)
Glucose, Bld: 341 mg/dL — ABNORMAL HIGH (ref 65–99)
Potassium: 3.5 mmol/L (ref 3.5–5.1)
SODIUM: 135 mmol/L (ref 135–145)

## 2018-01-16 LAB — CBC
HCT: 37.2 % (ref 35.0–47.0)
Hemoglobin: 12.4 g/dL (ref 12.0–16.0)
MCH: 30.3 pg (ref 26.0–34.0)
MCHC: 33.4 g/dL (ref 32.0–36.0)
MCV: 90.8 fL (ref 80.0–100.0)
PLATELETS: 261 10*3/uL (ref 150–440)
RBC: 4.1 MIL/uL (ref 3.80–5.20)
RDW: 12.8 % (ref 11.5–14.5)
WBC: 4.6 10*3/uL (ref 3.6–11.0)

## 2018-01-16 LAB — TROPONIN I
Troponin I: <0.03 ng/mL (ref <0.03)
Troponin I: <0.03 ng/mL (ref <0.03)

## 2018-01-16 MED ORDER — METFORMIN HCL 500 MG PO TABS
1000.0000 mg | ORAL_TABLET | Freq: Once | ORAL | Status: AC
  Administered 2018-01-16: 1000 mg via ORAL
  Filled 2018-01-16: qty 2

## 2018-01-16 MED ORDER — LISINOPRIL 10 MG PO TABS
20.0000 mg | ORAL_TABLET | Freq: Once | ORAL | Status: AC
Start: 2018-01-16 — End: 2018-01-16
  Administered 2018-01-16: 20 mg via ORAL
  Filled 2018-01-16: qty 2

## 2018-01-16 MED ORDER — HYDROCHLOROTHIAZIDE 25 MG PO TABS
25.0000 mg | ORAL_TABLET | Freq: Every day | ORAL | Status: DC
  Administered 2018-01-16: 25 mg via ORAL
  Filled 2018-01-16: qty 1

## 2018-01-16 MED ORDER — ASPIRIN 81 MG PO CHEW
324.0000 mg | CHEWABLE_TABLET | Freq: Once | ORAL | Status: AC
  Administered 2018-01-16: 324 mg via ORAL
  Filled 2018-01-16: qty 4

## 2018-01-16 MED ORDER — ACETAMINOPHEN 500 MG PO TABS
1000.0000 mg | ORAL_TABLET | Freq: Once | ORAL | Status: AC
  Administered 2018-01-16: 1000 mg via ORAL
  Filled 2018-01-16: qty 2

## 2018-01-16 NOTE — ED Triage Notes (Signed)
Pt c/o itchy rash that started at the base of the ribs on the right and wrap around to the flank area since Saturday. Pt also c/o heaviness on her chest. Pt is in NAD on arrival , rash has a shingles looking appearance.

## 2018-01-16 NOTE — ED Notes (Signed)
Patient transported to X-ray 

## 2018-01-16 NOTE — ED Provider Notes (Signed)
[facility] Emergency Department Provider Note  ____________________________________________  Time seen: Approximately 5:11 PM  I have reviewed the triage vital signs and the nursing notes.   HISTORY  Chief Complaint Rash and Chest Pain   HPI Ashley Spears is a 57 y.o. female with a history of diabetes and hypertension who presents for evaluation of chest pain and rash. Patient reports that 3 days ago she noted a rash located in her right abdominal/flank area that is itchy. Today she showed to a nurse at her work who told her it could be shingles and told her to be seen. She went to the walk-in and was severely hypertensive with systolics in the 200s and complaining of heaviness in her chest so she was sent to the emergency room for evaluation. She is on lisinopril and hydrochlorothiazide and has not taken any of her medications today. She does have a history of chickenpox as a child. Denies any history of shingles before. She reports that the rash has been constant and pruritic in nature. Patient reports a strong family history of ischemic heart disease. She had a stressor/year which was normal. No history of smoking. She reports that every time she feels anxious she develops chest pain. She reports that she was very anxious at the walk-in clinic. She reports that the chest pain was pressure in nature like somebody was sitting on her chest, moderate constant for about 30 minutes and nonradiating. No shortness of breath, diaphoresis, dizziness, nausea or vomiting. She denies any chest pain at this time. No personal or family history of blood clots, no recent travel immobilization, no leg pain or swelling, no hemoptysis, no exogenous hormones.  Past Medical History:  Diagnosis Date  . Diabetes mellitus without complication (HCC)   . Hypertension     Patient Active Problem List   Diagnosis Date Noted  . TIA (transient ischemic attack) 09/23/2015  . CVA (cerebral  infarction) 09/23/2015    Past Surgical History:  Procedure Laterality Date  . CESAREAN SECTION    . OOPHORECTOMY      Prior to Admission medications   Medication Sig Start Date End Date Taking? Authorizing Provider  aspirin EC 81 MG EC tablet Take 1 tablet (81 mg total) by mouth daily. 09/24/15   Katha Hamming, MD  atorvastatin (LIPITOR) 20 MG tablet Take 1 tablet (20 mg total) by mouth daily. 09/24/15   Katha Hamming, MD  gabapentin (NEURONTIN) 100 MG capsule Take 1 capsule (100 mg total) by mouth 3 (three) times daily. 09/24/15   Katha Hamming, MD  ibuprofen (ADVIL,MOTRIN) 800 MG tablet Take 800 mg by mouth every 8 (eight) hours as needed for mild pain.    [provider]  lisinopril-hydrochlorothiazide (PRINZIDE,ZESTORETIC) 20-25 MG tablet Take 1 tablet by mouth daily.    [provider]  metFORMIN (GLUCOPHAGE) 1000 MG tablet Take 1,000 mg by mouth 2 (two) times daily.    [provider]  norethindrone (AYGESTIN) 5 MG tablet Take 5 mg by mouth daily.    [provider]    Allergies Patient has no known allergies.  Family History  Problem Relation Age of Onset  . Hypertension Other     Social History Social History   Tobacco Use  . Smoking status: Never Smoker  . Smokeless tobacco: Never Used  Substance Use Topics  . Alcohol use: No  . Drug use: Not on file    Review of Systems  Constitutional: Negative for fever. Eyes: Negative for visual  changes. ENT: Negative for sore throat. Neck: No neck pain  Cardiovascular: + chest pain. Respiratory: Negative for shortness of breath. Gastrointestinal: Negative for abdominal pain, vomiting or diarrhea. Genitourinary: Negative for dysuria. Musculoskeletal: Negative for back pain. Skin: + rash. Neurological: Negative for headaches, weakness or numbness. Psych: No SI or HI  ____________________________________________   PHYSICAL EXAM:  VITAL SIGNS: ED Triage Vitals   Enc Vitals Group     BP 01/16/18 1559 (!) 204/107     Pulse Rate 01/16/18 1559 90     Resp 01/16/18 1559 18     Temp 01/16/18 1559 98.8 F (37.1 C)     Temp Source 01/16/18 1559 Oral     SpO2 01/16/18 1559 100 %     Weight 01/16/18 1559 149 lb (67.6 kg)     Height 01/16/18 1558 5\' 1"  (1.549 m)     Head Circumference --      Peak Flow --      Pain Score 01/16/18 1630 3     Pain Loc --      Pain Edu? --      Excl. in GC? --     Constitutional: Alert and oriented. Well appearing and in no apparent distress. HEENT:      Head: Normocephalic and atraumatic.         Eyes: Conjunctivae are normal. Sclera is non-icteric.       Mouth/Throat: Mucous membranes are moist.       Neck: Supple with no signs of meningismus. Cardiovascular: Regular rate and rhythm. No murmurs, gallops, or rubs. 2+ symmetrical distal pulses are present in all extremities. No JVD. Respiratory: Normal respiratory effort. Lungs are clear to auscultation bilaterally. No wheezes, crackles, or rhonchi.  Gastrointestinal: Soft, non tender, and non distended with positive bowel sounds. No rebound or guarding. Musculoskeletal: Nontender with normal range of motion in all extremities. No edema, cyanosis, or erythema of extremities. Neurologic: Normal speech and language. Face is symmetric. Moving all extremities. No gross focal neurologic deficits are appreciated. Skin: Skin is warm, dry and intact. There are 6 or 7 blanching erythematous papular lesions located in the R lateral abdominal wall and flank area, no blisters Psychiatric: Mood and affect are normal. Speech and behavior are normal.  ____________________________________________   LABS (all labs ordered are listed, but only abnormal results are displayed)  Labs Reviewed  BASIC METABOLIC PANEL - Abnormal; Notable for the following components:      Result Value   Chloride 100 (*)    Glucose, Bld 341 (*)    All other components within normal limits  CBC    TROPONIN I  TROPONIN I   ____________________________________________  EKG  ED ECG REPORT I, Nita Sicklearolina Raffael Bugarin, the attending physician, personally viewed and interpreted this ECG.  Normal sinus rhythm, rate of 91, normal intervals, normal axis, no ST elevations or depressions. Unchanged from prior. ____________________________________________  RADIOLOGY  I have personally reviewed the images performed during this visit and I agree with the Radiologist's read.   Interpretation by Radiologist:  Dg Chest 2 View  Result Date: 01/16/2018 CLINICAL DATA:  Chest pain EXAM: CHEST  2 VIEW COMPARISON:  06/30/2011 FINDINGS: The heart size and mediastinal contours are within normal limits. Both lungs are clear. There are degenerative changes of the spine. IMPRESSION: No active cardiopulmonary disease. Electronically Signed   By: Jasmine PangKim  Fujinaga M.D.   On: 01/16/2018 17:42      ____________________________________________   PROCEDURES  Procedure(s) performed: None Procedures Critical Care performed:  None ____________________________________________   INITIAL IMPRESSION / ASSESSMENT AND PLAN / ED COURSE   57 y.o. female with a history of diabetes and hypertension who presents for evaluation of chest pain and rash.   # rash: Rash looks like insect bites and not shingles.   # Chest heaviness: The patient is severely hypertensive with systolics in the 220s with an episode of chest heaviness prior to arrival. She has strong family history of ischemic heart disease. Her EKG shows no ischemic changes. She has not taken her medications this morning. We'll give her morning dose of lisinopril hydrochlorothiazide. We'll give a full aspirin. We'll cycle cardiac enzymes and monitoring on telemetry for any signs of ACS.    _________________________ 8:00 PM on 01/16/2018 -----------------------------------------  Blood pressure trended down appropriately. Patient remains asymptomatic. Troponin  2 is negative. At this time we'll refer her back to her primary care doctor for further management of elevated blood pressure. Discussed strict return precautions with patient. Patient will be discharged home at this time.   As part of my medical decision making, I reviewed the following data within the electronic MEDICAL RECORD NUMBER Nursing notes reviewed and incorporated, Labs reviewed , EKG interpreted , Old EKG reviewed, Radiograph reviewed , Notes from prior ED visits and [place] Controlled Substance Database    Pertinent labs & imaging results that were available during my care of the patient were reviewed by me and considered in my medical decision making (see chart for details).    ____________________________________________   FINAL CLINICAL IMPRESSION(S) / ED DIAGNOSES  Final diagnoses:  Insect bite, initial encounter  Chest pain, unspecified type  Malignant hypertension      NEW MEDICATIONS STARTED DURING THIS VISIT:  ED Discharge Orders    None       Note:  This document was prepared using Dragon voice recognition software and may include unintentional dictation errors.    Don Perking, [place], MD 01/16/18 2001

## 2018-01-16 NOTE — ED Notes (Signed)
This tech unhooked pt from monitor. Pt ambulated to bathroom unassisted.

## 2018-01-16 NOTE — Discharge Instructions (Signed)
You were seen for chest pain. Your workup today was reassuring. As I explained to you that does not mean that you do not have heart disease. You may need further evaluation to ensure you do not have a serious heart problem. Therefore it is imperative that you follow up with your doctor in 1-2 days for further evaluation of your blood pressure.  When should you call for help?  Call 911 if: You passed out (lost consciousness) or if you feel dizzy. You have difficulty breathing. You have symptoms of a heart attack. These may include: Chest pain or pressure, or a strange feeling in your chest. Indigestion. Sweating. Shortness of breath. Nausea or vomiting. Pain, pressure, or a strange feeling in your back, neck, jaw, or upper belly or in one or both shoulders or arms. Lightheadedness or sudden weakness. A fast or irregular heartbeat. After you call 911, the operator may tell you to chew 1 adult-strength or 2 to 4 low-dose aspirin. Wait for an ambulance. Do not try to drive yourself.   Call your doctor today if: You have any trouble breathing. Your chest pain gets worse. You are dizzy or lightheaded, or you feel like you may faint. You are not getting better as expected. You are having new or different chest pain  How can you care for yourself at home? Rest until you feel better. Take your medicine exactly as prescribed. Call your doctor if you think you are having a problem with your medicine. Do not drive after taking a prescription pain medicine.

## 2018-01-16 NOTE — ED Triage Notes (Deleted)
Pt states she has been sick with cough and congestion for the past 3 weeks, states in the past couple of days having chest pain with peripheral edema and wheezing

## 2019-12-12 ENCOUNTER — Emergency Department: Payer: BC Managed Care – PPO

## 2019-12-12 ENCOUNTER — Emergency Department
Admission: EM | Admit: 2019-12-12 | Discharge: 2019-12-12 | Disposition: A | Discharge status: Home / Self Care | Payer: BC Managed Care – PPO | Attending: Emergency Medicine | Admitting: Emergency Medicine

## 2019-12-12 ENCOUNTER — Encounter: Payer: Self-pay | Admitting: Emergency Medicine

## 2019-12-12 ENCOUNTER — Other Ambulatory Visit: Payer: Self-pay

## 2019-12-12 DIAGNOSIS — Z7982 Long term (current) use of aspirin: Secondary | ICD-10-CM | POA: Insufficient documentation

## 2019-12-12 DIAGNOSIS — E119 Type 2 diabetes mellitus without complications: Secondary | ICD-10-CM | POA: Insufficient documentation

## 2019-12-12 DIAGNOSIS — Z8673 Personal history of transient ischemic attack (TIA), and cerebral infarction without residual deficits: Secondary | ICD-10-CM | POA: Insufficient documentation

## 2019-12-12 DIAGNOSIS — M5412 Radiculopathy, cervical region: Secondary | ICD-10-CM

## 2019-12-12 DIAGNOSIS — Z7984 Long term (current) use of oral hypoglycemic drugs: Secondary | ICD-10-CM | POA: Insufficient documentation

## 2019-12-12 DIAGNOSIS — I10 Essential (primary) hypertension: Secondary | ICD-10-CM | POA: Diagnosis not present

## 2019-12-12 DIAGNOSIS — R519 Headache, unspecified: Secondary | ICD-10-CM | POA: Diagnosis not present

## 2019-12-12 DIAGNOSIS — R202 Paresthesia of skin: Secondary | ICD-10-CM | POA: Insufficient documentation

## 2019-12-12 DIAGNOSIS — M542 Cervicalgia: Secondary | ICD-10-CM | POA: Diagnosis present

## 2019-12-12 DIAGNOSIS — Z79899 Other long term (current) drug therapy: Secondary | ICD-10-CM | POA: Insufficient documentation

## 2019-12-12 LAB — APTT: aPTT: 25 seconds (ref 24–36)

## 2019-12-12 LAB — CBC
HCT: 35.2 % — ABNORMAL LOW (ref 36.0–46.0)
Hemoglobin: 11.5 g/dL — ABNORMAL LOW (ref 12.0–15.0)
MCH: 28.7 pg (ref 26.0–34.0)
MCHC: 32.7 g/dL (ref 30.0–36.0)
MCV: 87.8 fL (ref 80.0–100.0)
Platelets: 232 10*3/uL (ref 150–400)
RBC: 4.01 MIL/uL (ref 3.87–5.11)
RDW: 11.9 % (ref 11.5–15.5)
WBC: 6.2 10*3/uL (ref 4.0–10.5)
nRBC: 0 % (ref 0.0–0.2)

## 2019-12-12 LAB — COMPREHENSIVE METABOLIC PANEL
ALT: 15 U/L (ref 0–44)
AST: 17 U/L (ref 15–41)
Albumin: 4.1 g/dL (ref 3.5–5.0)
Alkaline Phosphatase: 103 U/L (ref 38–126)
Anion gap: 10 (ref 5–15)
BUN: 17 mg/dL (ref 6–20)
CO2: 29 mmol/L (ref 22–32)
Calcium: 9.3 mg/dL (ref 8.9–10.3)
Chloride: 99 mmol/L (ref 98–111)
Creatinine, Ser: 0.66 mg/dL (ref 0.44–1.00)
GFR calc Af Amer: >60 mL/min (ref >60)
GFR calc non Af Amer: >60 mL/min (ref >60)
Glucose, Bld: 304 mg/dL — ABNORMAL HIGH (ref 70–99)
Potassium: 3.5 mmol/L (ref 3.5–5.1)
Sodium: 138 mmol/L (ref 135–145)
Total Bilirubin: 0.9 mg/dL (ref 0.3–1.2)
Total Protein: 8 g/dL (ref 6.5–8.1)

## 2019-12-12 LAB — DIFFERENTIAL
Abs Immature Granulocytes: 0.01 10*3/uL (ref 0.00–0.07)
Basophils Absolute: 0 10*3/uL (ref 0.0–0.1)
Basophils Relative: 0 %
Eosinophils Absolute: 0 10*3/uL (ref 0.0–0.5)
Eosinophils Relative: 0 %
Immature Granulocytes: 0 %
Lymphocytes Relative: 23 %
Lymphs Abs: 1.5 10*3/uL (ref 0.7–4.0)
Monocytes Absolute: 0.2 10*3/uL (ref 0.1–1.0)
Monocytes Relative: 4 %
Neutro Abs: 4.5 10*3/uL (ref 1.7–7.7)
Neutrophils Relative %: 73 %

## 2019-12-12 LAB — PROTIME-INR
INR: 1 (ref 0.8–1.2)
Prothrombin Time: 12.7 seconds (ref 11.4–15.2)

## 2019-12-12 MED ORDER — KETOROLAC TROMETHAMINE 30 MG/ML IJ SOLN
30.0000 mg | Freq: Once | INTRAMUSCULAR | Status: AC
Start: 2019-12-12 — End: 2019-12-12
  Administered 2019-12-12: 30 mg via INTRAMUSCULAR
  Filled 2019-12-12: qty 1

## 2019-12-12 MED ORDER — DEXAMETHASONE SODIUM PHOSPHATE 10 MG/ML IJ SOLN
8.0000 mg | Freq: Once | INTRAMUSCULAR | Status: AC
  Administered 2019-12-12: 8 mg via INTRAVENOUS
  Filled 2019-12-12: qty 1

## 2019-12-12 NOTE — ED Provider Notes (Signed)
[facility] Emergency Department Provider Note   ____________________________________________    I have reviewed the triage vital signs and the nursing notes.   HISTORY  Chief Complaint Neck Pain and Numbness     HPI Ashley Spears is a 59 y.o. female who presents with complaints of left-sided lower neck pain, and pain in her left upper arm as well as tingling in her fingertips.  She feels as though her left hand grip is slightly weak.  She reports this started yesterday.  She denies injury.  She denies headache.  No other neuro deficits.  No fevers or chills.  No nausea or vomiting.  No lower extremity symptoms.  No loss of urinary continence  Past Medical History:  Diagnosis Date  . Diabetes mellitus without complication (HCC)   . Hypertension     Patient Active Problem List   Diagnosis Date Noted  . TIA (transient ischemic attack) 09/23/2015  . CVA (cerebral infarction) 09/23/2015    Past Surgical History:  Procedure Laterality Date  . CESAREAN SECTION    . OOPHORECTOMY      Prior to Admission medications   Medication Sig Start Date End Date Taking? Authorizing Provider  aspirin EC 81 MG EC tablet Take 1 tablet (81 mg total) by mouth daily. 09/24/15   Katha Hamming, MD  atorvastatin (LIPITOR) 20 MG tablet Take 1 tablet (20 mg total) by mouth daily. 09/24/15   Katha Hamming, MD  gabapentin (NEURONTIN) 100 MG capsule Take 1 capsule (100 mg total) by mouth 3 (three) times daily. 09/24/15   Katha Hamming, MD  ibuprofen (ADVIL,MOTRIN) 800 MG tablet Take 800 mg by mouth every 8 (eight) hours as needed for mild pain.    [provider]  lisinopril-hydrochlorothiazide (PRINZIDE,ZESTORETIC) 20-25 MG tablet Take 1 tablet by mouth daily.    [provider]  metFORMIN (GLUCOPHAGE) 1000 MG tablet Take 1,000 mg by mouth 2 (two) times daily.    [provider]  norethindrone (AYGESTIN) 5 MG tablet Take 5 mg  by mouth daily.    [provider]     Allergies Patient has no known allergies.  Family History  Problem Relation Age of Onset  . Hypertension Other     Social History Social History   Tobacco Use  . Smoking status: Never Smoker  . Smokeless tobacco: Never Used  Substance Use Topics  . Alcohol use: No  . Drug use: Not on file    Review of Systems  Constitutional: No fever/chills Eyes: No visual changes.  ENT: Neck pain as above Cardiovascular: Denies chest pain. Respiratory: Denies shortness of breath. Gastrointestinal: No abdominal pain.   Genitourinary: Negative for dysuria. Musculoskeletal: Neck pain as above Skin: Negative for rash. Neurological: As above   ____________________________________________   PHYSICAL EXAM:  VITAL SIGNS: ED Triage Vitals  Enc Vitals Group     BP 12/12/19 1150 (!) 190/103     Pulse Rate 12/12/19 1223 88     Resp 12/12/19 1223 18     Temp 12/12/19 1150 98.7 F (37.1 C)     Temp Source 12/12/19 1150 Oral     SpO2 12/12/19 1223 99 %     Weight 12/12/19 1148 68 kg (150 lb)     Height 12/12/19 1148 1.575 m (5\' 2" )     Head Circumference --      Peak Flow --      Pain Score 12/12/19 1148 9     Pain Loc --  Pain Edu? --      Excl. in [place]? --     Constitutional: Alert and oriented.   Nose: No congestion/rhinnorhea. Mouth/Throat: Mucous membranes are moist.   Neck: Patient holding head to the left, mild tenderness palpation over C7 Cardiovascular: Normal rate, regular rhythm. Grossly normal heart sounds.  Good peripheral circulation. Respiratory: Normal respiratory effort.  No retractions. Lungs CTAB. Gastrointestinal: Soft and nontender. No distention.  No CVA tenderness. Genitourinary: deferred Musculoskeletal: Warm and well perfused Neurologic:  Normal speech and language.  Possibly mildly decreased strength in the left grip, sensation appears intact, normal strength in the lower extremities, no saddle  anesthesia, no facial droop, cranial nerves normal Skin:  Skin is warm, dry and intact. No rash noted. Psychiatric: Mood and affect are normal. Speech and behavior are normal.  ____________________________________________   LABS (all labs ordered are listed, but only abnormal results are displayed)  Labs Reviewed  CBC - Abnormal; Notable for the following components:      Result Value   Hemoglobin 11.5 (*)    HCT 35.2 (*)    All other components within normal limits  COMPREHENSIVE METABOLIC PANEL - Abnormal; Notable for the following components:   Glucose, Bld 304 (*)    All other components within normal limits  PROTIME-INR  APTT  DIFFERENTIAL   ____________________________________________  EKG ED ECG REPORT I, Lavonia Drafts, the attending physician, personally viewed and interpreted this ECG.  Date: 12/12/2019  Rhythm: normal sinus rhythm QRS Axis: normal Intervals: normal ST/T Wave abnormalities: normal Narrative Interpretation: no evidence of acute ischemia   ____________________________________________  RADIOLOGY  CT head unremarkable MR cervical spine ____________________________________________   PROCEDURES  Procedure(s) performed: No  Procedures   Critical Care performed: No ____________________________________________   INITIAL IMPRESSION / ASSESSMENT AND PLAN / ED COURSE  Pertinent labs & imaging results that were available during my care of the patient were reviewed by me and considered in my medical decision making (see chart for details).  Patient presents with left-sided lower neck pain with likely radiculopathy into the left arm.  Initial concern for possible stroke in triage by nurse and CT head was ordered however upon my examination more concerned about cervical radiculopathy, will treat with IM Decadron and Toradol, MRI obtained.  Discussed MRI results with Dr. Cari Caraway of neurosurgery, he will see the patient in his office today at  430, no indication for urgent or emergent surgery per Dr. Izora Ribas    ____________________________________________   FINAL CLINICAL IMPRESSION(S) / ED DIAGNOSES  Final diagnoses:  Cervical radiculopathy        Note:  This document was prepared using Dragon voice recognition software and may include unintentional dictation errors.   Lavonia Drafts, MD 12/12/19 [phone]

## 2019-12-12 NOTE — ED Triage Notes (Signed)
Says pain pain left neck/shoulder with tingling down to fingers on left side.  Started ?Tuesday.  Thought she slept wrong but not better.  Today at work she had nurse take her bp and it was up.

## 2019-12-12 NOTE — ED Notes (Signed)
See triage note  Presents with left sided neck pain which radiates into left arm  States she noticed this yesterday  And noticed that her b/p was elevated.  States pain and numbness is moving into left arm  Grips equal

## 2019-12-12 NOTE — Discharge Instructions (Signed)
Please see Dr. Myer Haff at 4:30 pm as we discussed

## 2021-03-25 ENCOUNTER — Other Ambulatory Visit: Payer: Self-pay | Admitting: Family Medicine

## 2021-03-25 ENCOUNTER — Ambulatory Visit
Admission: RE | Admit: 2021-03-25 | Discharge: 2021-03-25 | Disposition: A | Discharge status: Home / Self Care | Payer: BC Managed Care – PPO | Attending: Family Medicine | Admitting: Family Medicine

## 2021-03-25 ENCOUNTER — Ambulatory Visit
Admission: RE | Admit: 2021-03-25 | Discharge: 2021-03-25 | Disposition: A | Discharge status: Home / Self Care | Payer: BC Managed Care – PPO | Source: Ambulatory Visit | Attending: Family Medicine | Admitting: Family Medicine

## 2021-03-25 ENCOUNTER — Other Ambulatory Visit: Payer: Self-pay

## 2021-03-25 DIAGNOSIS — R059 Cough, unspecified: Secondary | ICD-10-CM

## 2021-10-12 ENCOUNTER — Other Ambulatory Visit: Payer: Self-pay | Admitting: Family Medicine

## 2021-10-12 DIAGNOSIS — Z1231 Encounter for screening mammogram for malignant neoplasm of breast: Secondary | ICD-10-CM

## 2021-12-30 ENCOUNTER — Ambulatory Visit
Admission: RE | Admit: 2021-12-30 | Discharge: 2021-12-30 | Disposition: A | Discharge status: Home / Self Care | Payer: BC Managed Care – PPO | Source: Ambulatory Visit | Attending: Family Medicine | Admitting: Family Medicine

## 2021-12-30 ENCOUNTER — Other Ambulatory Visit: Payer: Self-pay

## 2021-12-30 DIAGNOSIS — Z1231 Encounter for screening mammogram for malignant neoplasm of breast: Secondary | ICD-10-CM | POA: Diagnosis not present

## 2022-01-03 ENCOUNTER — Other Ambulatory Visit: Payer: Self-pay | Admitting: *Deleted

## 2022-01-03 ENCOUNTER — Inpatient Hospital Stay
Admission: RE | Admit: 2022-01-03 | Discharge: 2022-01-03 | Disposition: A | Discharge status: Home / Self Care | Payer: Self-pay | Source: Ambulatory Visit | Attending: *Deleted | Admitting: *Deleted

## 2022-01-03 DIAGNOSIS — Z1231 Encounter for screening mammogram for malignant neoplasm of breast: Secondary | ICD-10-CM

## 2022-07-12 ENCOUNTER — Other Ambulatory Visit: Payer: Self-pay | Admitting: Physician Assistant

## 2022-07-12 DIAGNOSIS — N644 Mastodynia: Secondary | ICD-10-CM

## 2022-07-26 ENCOUNTER — Ambulatory Visit
Admission: EM | Admit: 2022-07-26 | Discharge: 2022-07-26 | Disposition: A | Discharge status: Home / Self Care | Payer: BC Managed Care – PPO | Attending: Internal Medicine | Admitting: Internal Medicine

## 2022-07-26 DIAGNOSIS — J3489 Other specified disorders of nose and nasal sinuses: Secondary | ICD-10-CM

## 2022-07-26 DIAGNOSIS — G44311 Acute post-traumatic headache, intractable: Secondary | ICD-10-CM

## 2022-07-26 MED ORDER — IBUPROFEN 800 MG PO TABS
800.0000 mg | ORAL_TABLET | Freq: Once | ORAL | Status: AC
  Administered 2022-07-26: 800 mg via ORAL
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
## 2022-08-16 ENCOUNTER — Ambulatory Visit
Admission: RE | Admit: 2022-08-16 | Discharge: 2022-08-16 | Disposition: A | Discharge status: Home / Self Care | Payer: BC Managed Care – PPO | Source: Ambulatory Visit | Attending: Physician Assistant | Admitting: Physician Assistant

## 2022-08-16 DIAGNOSIS — N644 Mastodynia: Secondary | ICD-10-CM | POA: Insufficient documentation

## 2022-08-23 ENCOUNTER — Other Ambulatory Visit: Payer: Self-pay | Admitting: Otolaryngology

## 2022-08-23 DIAGNOSIS — G9608 Other cranial cerebrospinal fluid leak: Secondary | ICD-10-CM

## 2022-08-23 DIAGNOSIS — R519 Headache, unspecified: Secondary | ICD-10-CM

## 2022-10-11 ENCOUNTER — Ambulatory Visit
Admission: RE | Admit: 2022-10-11 | Discharge: 2022-10-11 | Disposition: A | Discharge status: Home / Self Care | Payer: No Typology Code available for payment source | Source: Ambulatory Visit | Attending: Otolaryngology | Admitting: Otolaryngology

## 2022-10-11 DIAGNOSIS — R519 Headache, unspecified: Secondary | ICD-10-CM

## 2022-10-11 DIAGNOSIS — G9608 Other cranial cerebrospinal fluid leak: Secondary | ICD-10-CM

## 2022-10-11 MED ORDER — GADOBENATE DIMEGLUMINE 529 MG/ML IV SOLN
14.0000 mL | Freq: Once | INTRAVENOUS | Status: AC | PRN
  Administered 2022-10-11: 14 mL via INTRAVENOUS

## 2022-12-15 IMAGING — MG MM DIGITAL SCREENING BILAT W/ TOMO AND CAD
6 of 12 series · 6 of 36 positions shown · non-contrast
Comparison: Previous exam(s).

CLINICAL DATA: Screening.

EXAM:
DIGITAL SCREENING BILATERAL MAMMOGRAM WITH TOMOSYNTHESIS AND CAD
TECHNIQUE: Bilateral screening digital craniocaudal and mediolateral oblique
mammograms were obtained. Bilateral screening digital breast
tomosynthesis was performed. The images were evaluated with
computer-aided detection.

[R MLO synth-2D]
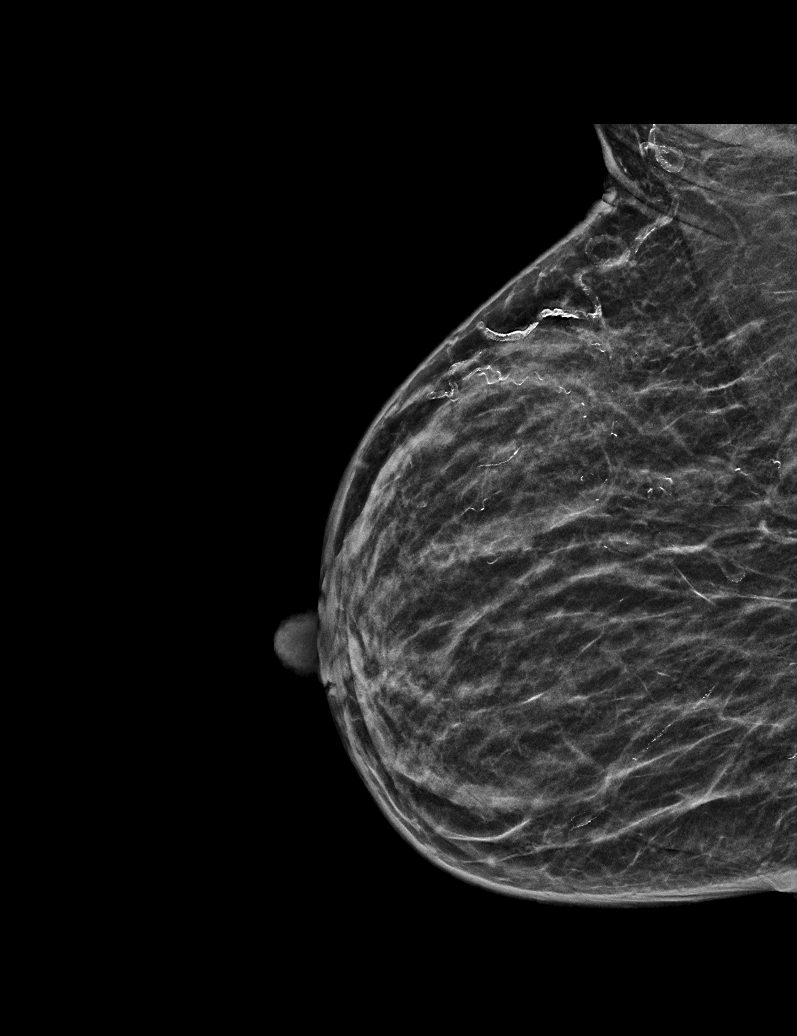

[R CC synth-2D (1 of 2)]
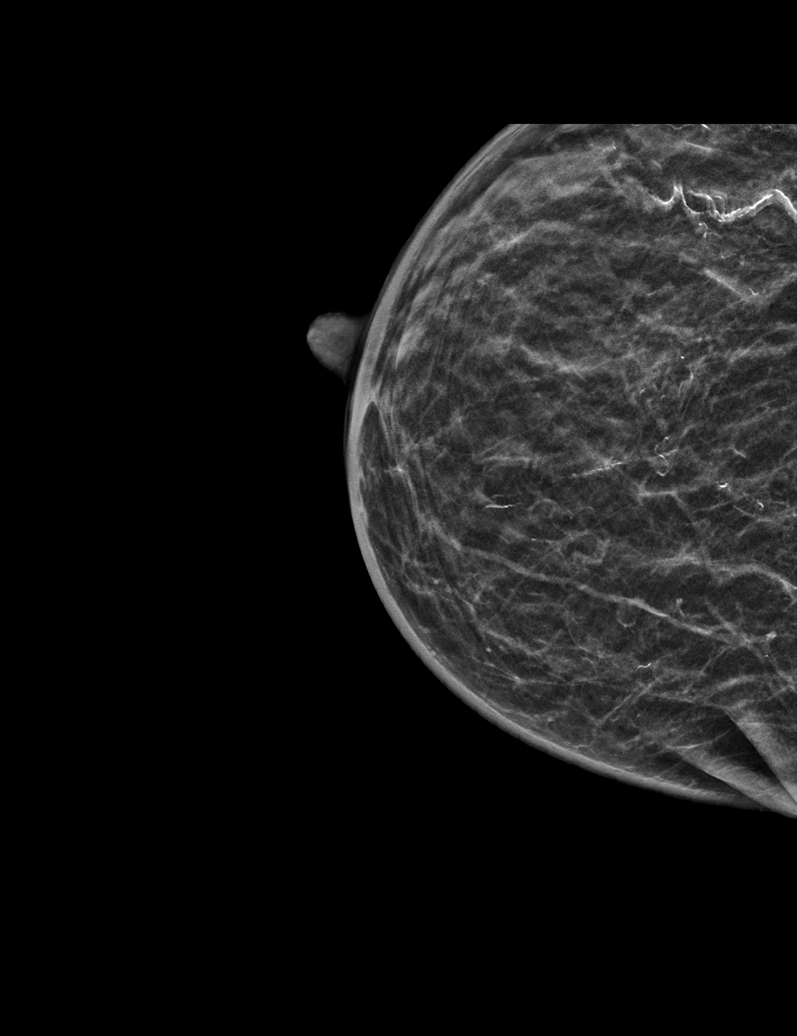

[L CC synth-2D]
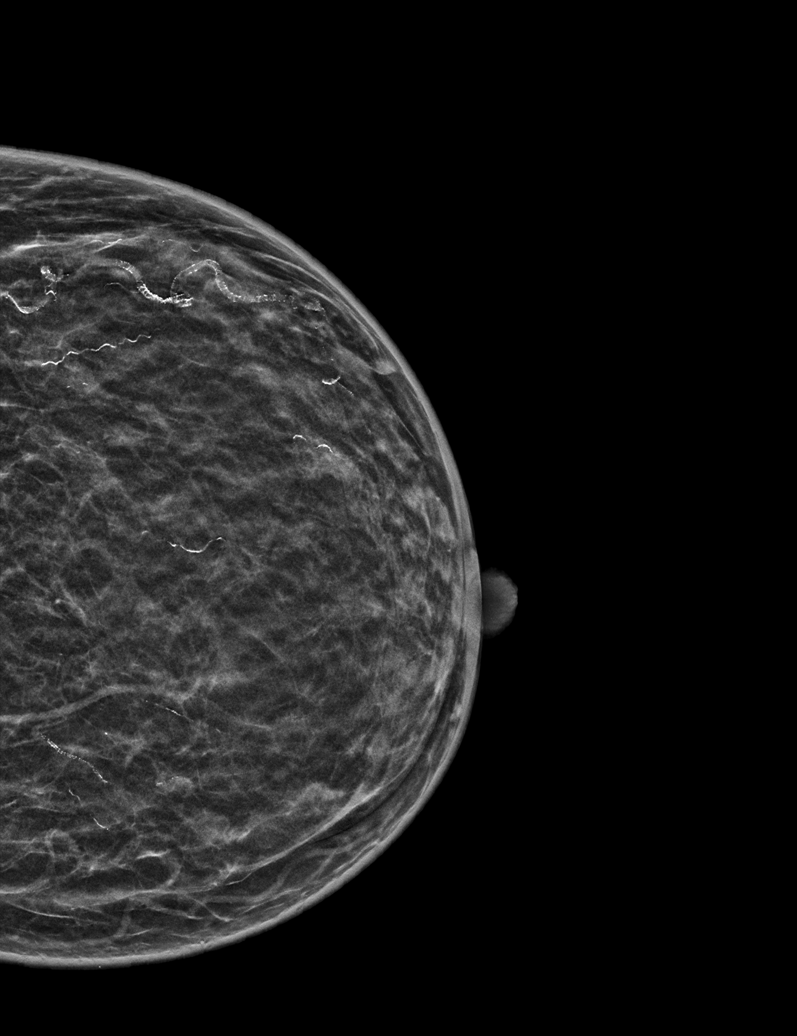

[R CC synth-2D (2 of 2)]
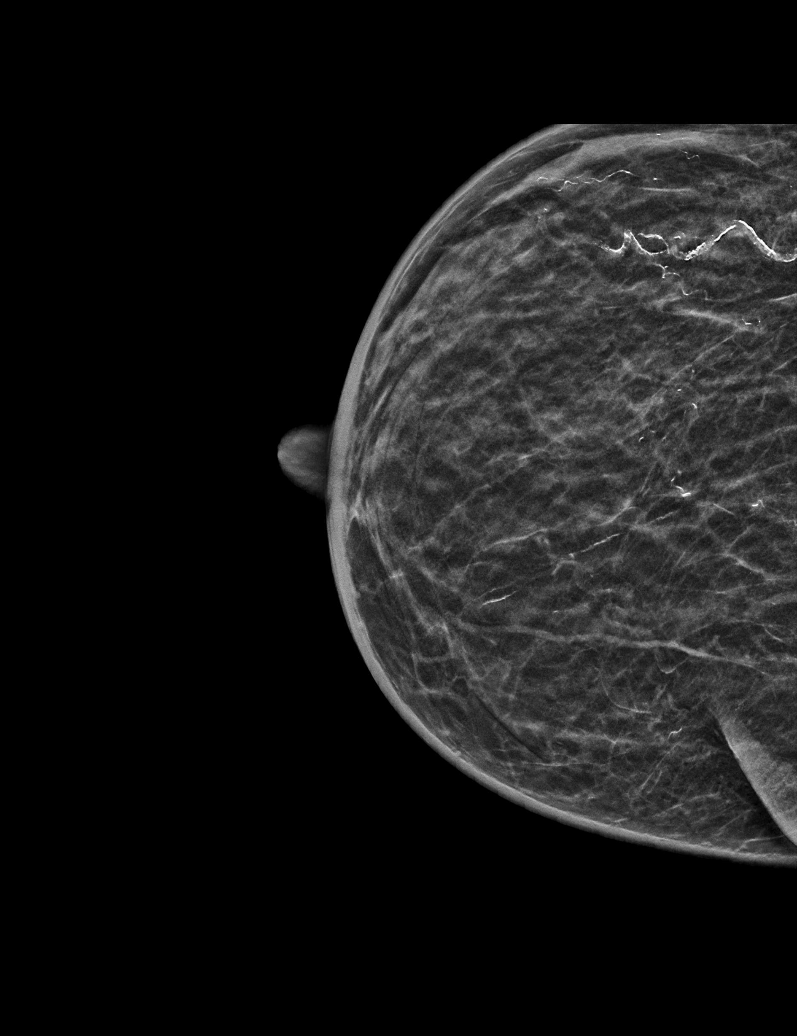

[L MLO synth-2D (1 of 2)]
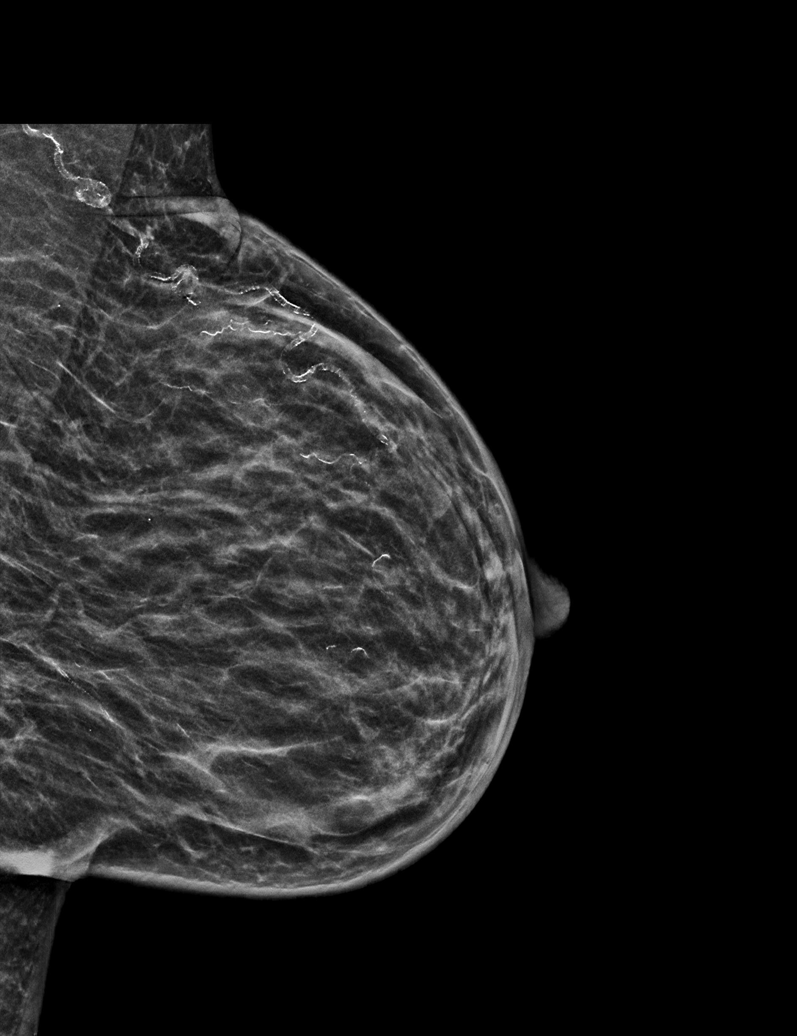

[L MLO synth-2D (2 of 2)]
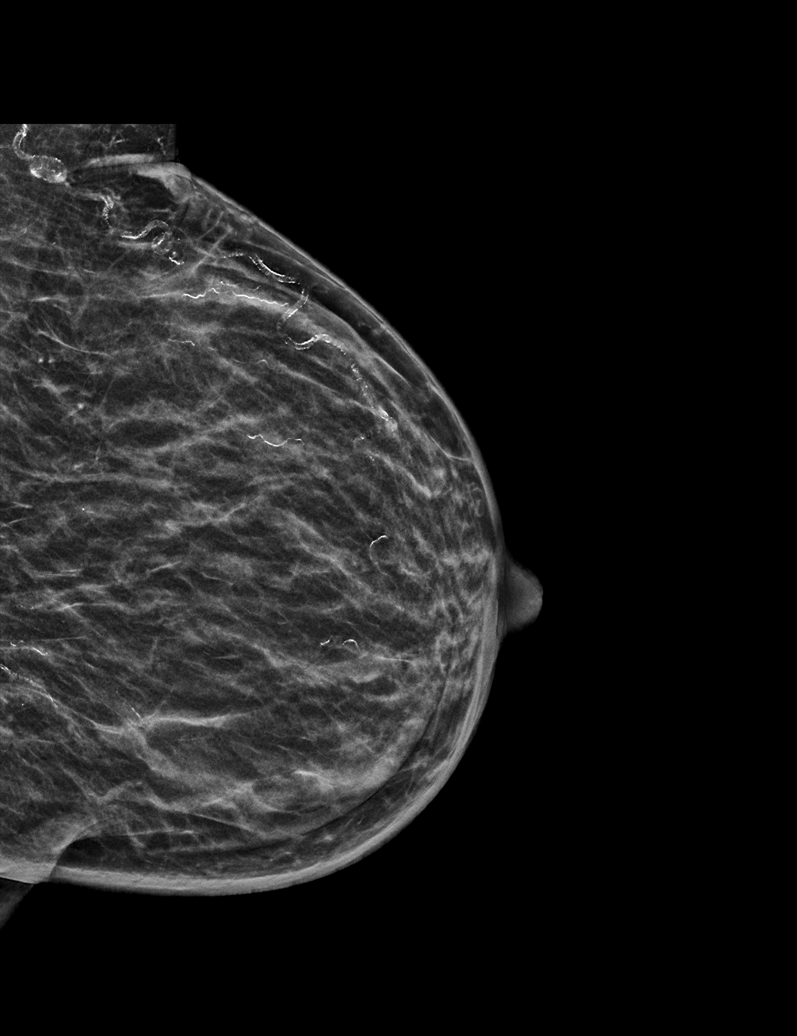

[6 of 36 positions shown; findings below may reference images not displayed]

ACR Breast Density Category b: There are scattered areas of
fibroglandular density.
FINDINGS: There are no findings suspicious for malignancy.
IMPRESSION: No mammographic evidence of malignancy. A result letter of this
screening mammogram will be mailed directly to the patient.

RECOMMENDATION:
Screening mammogram in one year. (Code:51-O-LD2)

BI-RADS CATEGORY  1: Negative.

## 2023-08-16 ENCOUNTER — Emergency Department: Payer: BC Managed Care – PPO

## 2023-08-16 ENCOUNTER — Other Ambulatory Visit: Payer: Self-pay

## 2023-08-16 ENCOUNTER — Ambulatory Visit
Admission: EM | Admit: 2023-08-16 | Discharge: 2023-08-16 | Disposition: A | Discharge status: Home / Self Care | Payer: BC Managed Care – PPO | Attending: Emergency Medicine | Admitting: Emergency Medicine

## 2023-08-16 ENCOUNTER — Emergency Department
Admission: EM | Admit: 2023-08-16 | Discharge: 2023-08-16 | Disposition: A | Discharge status: Home / Self Care | Payer: BC Managed Care – PPO | Attending: Emergency Medicine | Admitting: Emergency Medicine

## 2023-08-16 DIAGNOSIS — R110 Nausea: Secondary | ICD-10-CM | POA: Diagnosis not present

## 2023-08-16 DIAGNOSIS — Z794 Long term (current) use of insulin: Secondary | ICD-10-CM

## 2023-08-16 DIAGNOSIS — R1011 Right upper quadrant pain: Secondary | ICD-10-CM

## 2023-08-16 DIAGNOSIS — N644 Mastodynia: Secondary | ICD-10-CM | POA: Diagnosis present

## 2023-08-16 DIAGNOSIS — B029 Zoster without complications: Secondary | ICD-10-CM | POA: Diagnosis not present

## 2023-08-16 DIAGNOSIS — E1165 Type 2 diabetes mellitus with hyperglycemia: Secondary | ICD-10-CM | POA: Diagnosis not present

## 2023-08-16 DIAGNOSIS — R81 Glycosuria: Secondary | ICD-10-CM

## 2023-08-16 DIAGNOSIS — M545 Low back pain, unspecified: Secondary | ICD-10-CM | POA: Diagnosis not present

## 2023-08-16 DIAGNOSIS — R350 Frequency of micturition: Secondary | ICD-10-CM

## 2023-08-16 LAB — LIPASE, BLOOD: Lipase: 26 U/L (ref 11–51)

## 2023-08-16 LAB — POCT URINALYSIS DIP (MANUAL ENTRY)
Bilirubin, UA: NEGATIVE
Blood, UA: NEGATIVE
Glucose, UA: 500 mg/dL — AB
Leukocytes, UA: NEGATIVE
Nitrite, UA: NEGATIVE
Protein Ur, POC: NEGATIVE mg/dL
Spec Grav, UA: 1.025 (ref 1.010–1.025)
Urobilinogen, UA: 1 E.U./dL
pH, UA: 5.5 (ref 5.0–8.0)

## 2023-08-16 LAB — CBC
HCT: 32.5 % — ABNORMAL LOW (ref 36.0–46.0)
Hemoglobin: 10.7 g/dL — ABNORMAL LOW (ref 12.0–15.0)
MCH: 29.2 pg (ref 26.0–34.0)
MCHC: 32.9 g/dL (ref 30.0–36.0)
MCV: 88.6 fL (ref 80.0–100.0)
Platelets: 232 10*3/uL (ref 150–400)
RBC: 3.67 MIL/uL — ABNORMAL LOW (ref 3.87–5.11)
RDW: 12 % (ref 11.5–15.5)
WBC: 4.4 10*3/uL (ref 4.0–10.5)
nRBC: 0 % (ref 0.0–0.2)

## 2023-08-16 LAB — COMPREHENSIVE METABOLIC PANEL
ALT: 18 U/L (ref 0–44)
AST: 16 U/L (ref 15–41)
Albumin: 3.7 g/dL (ref 3.5–5.0)
Alkaline Phosphatase: 102 U/L (ref 38–126)
Anion gap: 9 (ref 5–15)
BUN: 18 mg/dL (ref 6–20)
CO2: 25 mmol/L (ref 22–32)
Calcium: 8.8 mg/dL — ABNORMAL LOW (ref 8.9–10.3)
Chloride: 103 mmol/L (ref 98–111)
Creatinine, Ser: 0.86 mg/dL (ref 0.44–1.00)
GFR, Estimated: >60 mL/min (ref >60)
Glucose, Bld: 325 mg/dL — ABNORMAL HIGH (ref 70–99)
Potassium: 4 mmol/L (ref 3.5–5.1)
Sodium: 137 mmol/L (ref 135–145)
Total Bilirubin: 1.1 mg/dL (ref 0.3–1.2)
Total Protein: 7 g/dL (ref 6.5–8.1)

## 2023-08-16 LAB — POCT FASTING CBG KUC MANUAL ENTRY: POCT Glucose (KUC): 300 mg/dL — AB (ref 70–99)

## 2023-08-16 MED ORDER — ACETAMINOPHEN 500 MG PO TABS
1000.0000 mg | ORAL_TABLET | Freq: Once | ORAL | Status: AC
  Administered 2023-08-16: 1000 mg via ORAL
  Filled 2023-08-16: qty 2

## 2023-08-16 MED ORDER — VALACYCLOVIR HCL 1 G PO TABS: 2000.0000 mg | ORAL_TABLET | Freq: Three times a day (TID) | ORAL | 0 refills | Status: AC

## 2023-08-16 MED ORDER — LIDOCAINE 5 % EX PTCH
3.0000 | MEDICATED_PATCH | Freq: Once | CUTANEOUS | Status: DC
  Administered 2023-08-16: 3 via TRANSDERMAL
  Filled 2023-08-16: qty 3

## 2023-08-16 NOTE — ED Notes (Signed)
Patient is being discharged from the Urgent Care and sent to the Emergency Department via POV . Per Wendee Beavers NP, patient is in need of higher level of care due to hyperglycemia/ abdominal pain. Patient is aware and verbalizes understanding of plan of care.  Vitals:   08/16/23 1127  BP: (!) 138/90  Pulse: 83  Resp: 18  Temp: 97.8 F (36.6 C)  SpO2: 98%

## 2023-08-16 NOTE — ED Notes (Signed)
See triage notes. Patient c/o burning and pain from mid lower back to mid abdomen.

## 2023-08-16 NOTE — ED Provider Notes (Signed)
[facility] Provider Note    Event Date/Time   First MD Initiated Contact with Patient 08/16/23 1248     (approximate)   History   Flank Pain (Patient was sent here by UC for further evaluation regarding pain that begins in her back and radiates to her abdomen (RIGHT sided back pain is worse than her LEFT sided back pain))   HPI  Ashley Spears is a 62 y.o. female presents to the emergency department with pain.  States that she started having pain on Thursday or Friday that started on her right back and radiated around underneath her right breast.  Ongoing pain that has worsened today.  Did notice a couple of bumps on her skin.  Seen at urgent care and sent to the emergency department for further evaluation of her gallbladder.  Endorses mild nausea but no episodes of vomiting.  No change with eating.  Denies any lower abdominal pain.  No fever or chills.  No dysuria.     Physical Exam   Triage Vital Signs: ED Triage Vitals  Encounter Vitals Group     BP 08/16/23 1234 (!) 194/94     Systolic BP Percentile --      Diastolic BP Percentile --      Pulse Rate 08/16/23 1234 77     Resp 08/16/23 1234 14     Temp 08/16/23 1234 99 F (37.2 C)     Temp Source 08/16/23 1234 Oral     SpO2 08/16/23 1234 98 %     Weight 08/16/23 1235 155 lb (70.3 kg)     Height 08/16/23 1235 5\' 2"  (1.575 m)     Head Circumference --      Peak Flow --      Pain Score 08/16/23 1235 9     Pain Loc --      Pain Education --      Exclude from Growth Chart --     Most recent vital signs: Vitals:   08/16/23 1234  BP: (!) 194/94  Pulse: 77  Resp: 14  Temp: 99 F (37.2 C)  SpO2: 98%    Physical Exam Constitutional:      Appearance: She is well-developed.  HENT:     Head: Atraumatic.  Eyes:     Conjunctiva/sclera: Conjunctivae normal.  Cardiovascular:     Rate and Rhythm: Regular rhythm.  Pulmonary:     Effort: No respiratory distress.  Abdominal:     General:  There is no distension.     Tenderness: There is abdominal tenderness (Right upper quadrant abdominal tenderness to palpation with no rebound or guarding.  Negative Murphy sign.).  Musculoskeletal:        General: Normal range of motion.     Cervical back: Normal range of motion.  Skin:    General: Skin is warm.     Capillary Refill: Capillary refill takes less than 2 seconds.     Findings: Rash present.     Comments: Faint rash with vesicles that are clustered subxiphoid and on the right lateral flank.  Neurological:     Mental Status: She is alert. Mental status is at baseline.      IMPRESSION / MDM / ASSESSMENT AND PLAN / ED COURSE  I reviewed the triage vital signs and the nursing notes.  Differential diagnosis including symptomatic cholelithiasis, acute cholecystitis, zoster.  Low suspicion for atypical ACS   RADIOLOGY I independently reviewed imaging, my interpretation of imaging: Right upper quadrant  ultrasound -no findings of acute cholecystitis or symptomatic cholelithiasis.  No signs of gallstones.   Labs (all labs ordered are listed, but only abnormal results are displayed) Labs interpreted as -    Labs Reviewed  CBC - Abnormal; Notable for the following components:      Result Value   RBC 3.67 (*)    Hemoglobin 10.7 (*)    HCT 32.5 (*)    All other components within normal limits  COMPREHENSIVE METABOLIC PANEL - Abnormal; Notable for the following components:   Glucose, Bld 325 (*)    Calcium 8.8 (*)    All other components within normal limits  LIPASE, BLOOD  URINALYSIS, ROUTINE W REFLEX MICROSCOPIC    Hyperglycemia but does not meet criteria for DKA.  Normal LFTs and T. bili.  Lipase within normal limits.  No leukocytosis.  Right upper quadrant ultrasound without findings of acute cholecystitis.  Clinical picture is most consistent with zoster.  Started on symptomatic treatment.  Discussed close follow-up with primary care provider.      PROCEDURES:  Critical Care performed: No  Procedures  Patient's presentation is most consistent with acute presentation with potential threat to life or bodily function.   MEDICATIONS ORDERED IN ED: Medications  lidocaine (LIDODERM) 5 % 3 patch (3 patches Transdermal Patch Applied 08/16/23 1450)  acetaminophen (TYLENOL) tablet 1,000 mg (1,000 mg Oral Given 08/16/23 1453)    FINAL CLINICAL IMPRESSION(S) / ED DIAGNOSES   Final diagnoses:  Herpes zoster without complication     Rx / DC Orders   ED Discharge Orders          Ordered    valACYclovir (VALTREX) 1000 MG tablet  Every 8 hours        08/16/23 1448             Note:  This document was prepared using Dragon voice recognition software and may include unintentional dictation errors.   Corena Herter, MD 08/16/23 1517

## 2023-08-16 NOTE — ED Provider Notes (Signed)
[name]    CSN: [id number] Arrival date & time: 08/16/23  1046      History   Chief Complaint Chief Complaint  Patient presents with   Back Pain    HPI Ashley Spears is a 62 y.o. female.  Patient presents with 1 week history of bilateral flank pain, worse on the right.  The pain radiates to her right upper abdomen.  The pain is intermittent but worse today.  She also noticed a rash on her upper and mid abdomen this morning.  She also has urinary frequency but no dysuria or hematuria.  She denies fever, chills, vomiting, diarrhea, constipation, or other symptoms.  Her medical history includes hypertension, CVA, diabetes.  Last A1c 11.6 on 07/08/2022. The history is provided by the patient and medical records.    Past Medical History:  Diagnosis Date   Diabetes mellitus without complication (HCC)    Hypertension     Patient Active Problem List   Diagnosis Date Noted   TIA (transient ischemic attack) 09/23/2015   CVA (cerebral infarction) 09/23/2015    Past Surgical History:  Procedure Laterality Date   CESAREAN SECTION     OOPHORECTOMY      OB History   No obstetric history on file.      Home Medications    Prior to Admission medications   Medication Sig Start Date End Date Taking? Authorizing Provider  atorvastatin (LIPITOR) 20 MG tablet Take 1 tablet (20 mg total) by mouth daily. 09/24/15   Katha Hamming, MD  glipiZIDE (GLUCOTROL XL) 10 MG 24 hr tablet Take 1 tablet by mouth 2 (two) times daily. 09/25/15   [provider]  JARDIANCE 10 MG TABS tablet Take 10 mg by mouth daily. 07/15/22   [provider]  lisinopril-hydrochlorothiazide (PRINZIDE,ZESTORETIC) 20-25 MG tablet Take 1 tablet by mouth daily.    [provider]  metFORMIN (GLUCOPHAGE) 1000 MG tablet Take 1,000 mg by mouth 2 (two) times daily.    [provider]    Family History Family History  Problem Relation Age of Onset   Breast cancer  Maternal Aunt    Hypertension Other     Social History Social History   Tobacco Use   Smoking status: Never   Smokeless tobacco: Never  Vaping Use   Vaping status: Never Used  Substance Use Topics   Alcohol use: No   Drug use: Never     Allergies   Patient has no known allergies.   Review of Systems Review of Systems  Constitutional:  Negative for chills and fever.  Respiratory:  Negative for cough and shortness of breath.   Cardiovascular:  Negative for chest pain and palpitations.  Gastrointestinal:  Positive for abdominal pain. Negative for constipation, diarrhea and vomiting.  Genitourinary:  Positive for frequency. Negative for dysuria and hematuria.  Musculoskeletal:  Positive for back pain.  Skin:  Positive for rash.     Physical Exam Triage Vital Signs ED Triage Vitals  Encounter Vitals Group     BP 08/16/23 1127 (!) 138/90     Systolic BP Percentile --      Diastolic BP Percentile --      Pulse Rate 08/16/23 1127 83     Resp 08/16/23 1127 18     Temp 08/16/23 1127 97.8 F (36.6 C)     Temp src --      SpO2 08/16/23 1127 98 %     Weight --      Height --  Head Circumference --      Peak Flow --      Pain Score 08/16/23 1126 8     Pain Loc --      Pain Education --      Exclude from Growth Chart --    No data found.  Updated Vital Signs BP (!) 138/90   Pulse 83   Temp 97.8 F (36.6 C)   Resp 18   LMP 01/13/2018 Comment: pt states not pregnant sporadic LMP  SpO2 98%   Visual Acuity Right Eye Distance:   Left Eye Distance:   Bilateral Distance:    Right Eye Near:   Left Eye Near:    Bilateral Near:     Physical Exam Constitutional:      General: She is not in acute distress. HENT:     Mouth/Throat:     Mouth: Mucous membranes are moist.  Cardiovascular:     Rate and Rhythm: Normal rate and regular rhythm.     Heart sounds: Normal heart sounds.  Pulmonary:     Effort: Pulmonary effort is normal. No respiratory distress.      Breath sounds: Normal breath sounds.  Abdominal:     General: Bowel sounds are normal.     Palpations: Abdomen is soft.     Tenderness: There is generalized abdominal tenderness and tenderness in the right upper quadrant. There is no right CVA tenderness, left CVA tenderness, guarding or rebound.     Comments: Mild generalized abdominal pain that is worse in RUQ.  No rebound or guarding.  Skin:    General: Skin is warm and dry.     Findings: Rash present.     Comments: Erythematous papular rash on upper and mid abdomen.  Neurological:     General: No focal deficit present.     Mental Status: She is alert and oriented to person, place, and time.     Gait: Gait normal.  Psychiatric:        Mood and Affect: Mood normal.        Behavior: Behavior normal.      UC Treatments / Results  Labs (all labs ordered are listed, but only abnormal results are displayed) Labs Reviewed  POCT URINALYSIS DIP (MANUAL ENTRY) - Abnormal; Notable for the following components:      Result Value   Glucose, UA =500 (*)    Ketones, POC UA small (15) (*)    All other components within normal limits  POCT FASTING CBG [facility] MANUAL ENTRY - Abnormal; Notable for the following components:   POCT Glucose ([facility]) 300 (*)    All other components within normal limits    EKG   Radiology No results found.  Procedures Procedures (including critical care time)  Medications Ordered in UC Medications - No data to display  Initial Impression / Assessment and Plan / UC Course  I have reviewed the triage vital signs and the nursing notes.  Pertinent labs & imaging results that were available during my care of the patient were reviewed by me and considered in my medical decision making (see chart for details).    Right upper quadrant abdominal pain, diabetes with hyperglycemia, bilateral low back pain without sciatica, urinary frequency, glucosuria.  Afebrile and vital signs are stable.  Abdomen is tender, worse  in the right upper quadrant.  Urine has glucose and ketones.  CBG 300.  Last A1c which was more than a year ago per chart was 11.6.  Discussed with patient  the limitations of evaluation of her abdominal pain in an urgent care setting.  Sending her to the ED for evaluation.  She is agreeable to this.  She declines EMS and feels stable to drive herself to the ED.  Final Clinical Impressions(s) / UC Diagnoses   Final diagnoses:  Right upper quadrant abdominal pain  Type 2 diabetes mellitus with hyperglycemia, with long-term current use of insulin (HCC)  Acute bilateral low back pain without sciatica  Urinary frequency  Glucosuria     Discharge Instructions      Go to the emergency department for evaluation of your high blood sugar and abdominal pain.      ED Prescriptions   None    PDMP not reviewed this encounter.   Mickie Bail, NP 08/16/23 [phone]

## 2023-08-16 NOTE — ED Triage Notes (Signed)
Patient to Urgent Care with complaints of flank pain that radiates into her RUQ. Symptoms started last week. Taking tylenol. Pain was relieved yesterday but started back this morning.  Reports noticing several red spots on her abdomen this morning.  Has had some urinary frequency but attributes this to increased fluid intake.

## 2023-08-16 NOTE — ED Triage Notes (Signed)
Patient was sent here by UC for further evaluation regarding pain that begins in her back and radiates to her abdomen (RIGHT sided back pain is worse than her LEFT sided back pain)

## 2023-08-16 NOTE — Discharge Instructions (Signed)
You were seen in the emergency department and diagnosed with shingles.  You can take ibuprofen and Tylenol for pain control.  You can get over-the-counter Lidoderm patches and apply, keep on for 12 hours and then remove.  You can use Benadryl 12.5 mg every 8 hours as needed for itching.  Follow-up closely with your primary care physician.  They can help start you on a nerve medication if you are still not having improvement of your pain with what we started today.  Pain control:  Ibuprofen (motrin/aleve/advil) - You can take 3 tablets (600 mg) every 6 hours as needed for pain/fever.  Acetaminophen (tylenol) - You can take 2 extra strength tablets (1000 mg) every 6 hours as needed for pain/fever.  You can alternate these medications or take them together.  Make sure you eat food/drink water when taking these medications.  Thank you for choosing [place] for your health care, it was my pleasure to care for you today!  Corena Herter, MD

## 2023-08-16 NOTE — Discharge Instructions (Signed)
Go to the emergency department for evaluation of your high blood sugar and abdominal pain.

## 2023-09-20 ENCOUNTER — Other Ambulatory Visit: Payer: Self-pay

## 2023-09-20 ENCOUNTER — Emergency Department: Payer: BC Managed Care – PPO

## 2023-09-20 ENCOUNTER — Emergency Department
Admission: EM | Admit: 2023-09-20 | Discharge: 2023-09-21 | Disposition: A | Discharge status: Home / Self Care | Payer: BC Managed Care – PPO | Attending: Emergency Medicine | Admitting: Emergency Medicine

## 2023-09-20 DIAGNOSIS — R519 Headache, unspecified: Secondary | ICD-10-CM | POA: Insufficient documentation

## 2023-09-20 DIAGNOSIS — Y92410 Unspecified street and highway as the place of occurrence of the external cause: Secondary | ICD-10-CM | POA: Insufficient documentation

## 2023-09-20 DIAGNOSIS — S161XXA Strain of muscle, fascia and tendon at neck level, initial encounter: Secondary | ICD-10-CM | POA: Diagnosis not present

## 2023-09-20 DIAGNOSIS — S20211A Contusion of right front wall of thorax, initial encounter: Secondary | ICD-10-CM | POA: Diagnosis not present

## 2023-09-20 DIAGNOSIS — S199XXA Unspecified injury of neck, initial encounter: Secondary | ICD-10-CM | POA: Diagnosis present

## 2023-09-20 MED ORDER — METHOCARBAMOL 500 MG PO TABS
1000.0000 mg | ORAL_TABLET | Freq: Once | ORAL | Status: AC
  Administered 2023-09-20: 1000 mg via ORAL
  Filled 2023-09-20: qty 2

## 2023-09-20 MED ORDER — MELOXICAM 7.5 MG PO TABS
15.0000 mg | ORAL_TABLET | Freq: Once | ORAL | Status: AC
  Administered 2023-09-20: 15 mg via ORAL
  Filled 2023-09-20: qty 2

## 2023-09-20 MED ORDER — METHOCARBAMOL 500 MG PO TABS: 500.0000 mg | ORAL_TABLET | Freq: Four times a day (QID) | ORAL | 0 refills | Status: DC

## 2023-09-20 MED ORDER — MELOXICAM 15 MG PO TABS: 15.0000 mg | ORAL_TABLET | Freq: Every day | ORAL | 0 refills | Status: DC
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
## 2024-03-29 NOTE — Progress Notes (Signed)
 [facility] Care Management - Unable To Reach Enrolled Patient  [facility] Manager attempted to contact Ronal Lady to discuss ongoing care management. Contact information left on voicemail.  Follow up: [facility] Manager will close case due to inability to reach the patient.  TRACEY Y BERCKMILLER  For more information on [facility] care management services, click here.

## 2024-06-06 ENCOUNTER — Other Ambulatory Visit: Payer: Self-pay | Admitting: Family Medicine

## 2024-06-06 DIAGNOSIS — Z1231 Encounter for screening mammogram for malignant neoplasm of breast: Secondary | ICD-10-CM

## 2024-06-18 ENCOUNTER — Encounter: Payer: Self-pay | Admitting: Gastroenterology

## 2024-06-19 NOTE — Anesthesia Preprocedure Evaluation (Signed)
 Anesthesia Evaluation    Airway        Dental   Pulmonary           Cardiovascular hypertension,   09-24-15 EF 60-65%, normal echo   Neuro/Psych    GI/Hepatic   Endo/Other  diabetes    Renal/GU      Musculoskeletal   Abdominal   Peds  Hematology   Anesthesia Other Findings HTN Diabetes mellitus  Reproductive/Obstetrics                              Anesthesia Physical Anesthesia Plan Anesthesia Quick Evaluation

## 2024-07-01 ENCOUNTER — Encounter: Payer: Self-pay | Admitting: Anesthesiology

## 2024-07-01 ENCOUNTER — Encounter: Admission: RE | Payer: Self-pay | Source: Home / Self Care

## 2024-07-01 ENCOUNTER — Ambulatory Visit: Admission: RE | Admit: 2024-07-01 | Source: Home / Self Care | Admitting: Gastroenterology

## 2024-07-01 SURGERY — COLONOSCOPY
Anesthesia: General

## 2024-07-08 ENCOUNTER — Encounter: Payer: Self-pay | Admitting: Anesthesiology

## 2024-07-16 ENCOUNTER — Ambulatory Visit: Admission: RE | Admit: 2024-07-16 | Source: Home / Self Care | Admitting: Gastroenterology

## 2024-07-16 SURGERY — COLONOSCOPY
Anesthesia: General

## 2024-08-09 ENCOUNTER — Encounter: Payer: Self-pay | Admitting: Gastroenterology

## 2024-08-13 ENCOUNTER — Encounter: Payer: Self-pay | Admitting: Gastroenterology

## 2024-08-13 ENCOUNTER — Other Ambulatory Visit: Payer: Self-pay

## 2024-08-13 ENCOUNTER — Ambulatory Visit: Payer: Self-pay | Admitting: Anesthesiology

## 2024-08-13 ENCOUNTER — Encounter
Admission: RE | Disposition: A | Discharge status: Home / Self Care | Payer: Self-pay | Source: Home / Self Care | Attending: Gastroenterology

## 2024-08-13 ENCOUNTER — Ambulatory Visit
Admission: RE | Admit: 2024-08-13 | Discharge: 2024-08-13 | Disposition: A | Discharge status: Home / Self Care | Attending: Gastroenterology | Admitting: Gastroenterology

## 2024-08-13 LAB — GLUCOSE, CAPILLARY: Glucose-Capillary: 247 mg/dL — ABNORMAL HIGH (ref 70–99)

## 2024-08-13 SURGERY — COLONOSCOPY
Anesthesia: General

## 2024-08-13 MED ORDER — INSULIN REGULAR HUMAN 100 UNIT/ML IJ SOLN
INTRAMUSCULAR | Status: AC
  Filled 2024-08-13: qty 5

## 2024-08-13 MED ORDER — SODIUM CHLORIDE 0.9 % IV SOLN: INTRAVENOUS | Status: DC

## 2024-08-13 MED ORDER — INSULIN NPH (HUMAN) (ISOPHANE) 100 UNIT/ML ~~LOC~~ SUSP: 5.0000 [IU] | Freq: Once | SUBCUTANEOUS | Status: DC

## 2024-08-13 MED ORDER — LACTATED RINGERS IV SOLN: INTRAVENOUS | Status: DC

## 2024-08-13 SURGICAL SUPPLY — 16 items

## 2024-08-13 NOTE — Discharge Summary (Shared)
 Patient was not cleaned out when MD Vanga asked about stool color and consistency. Patient to be rescheduled and to have a 2 day prep.

## 2024-08-13 NOTE — Anesthesia Postprocedure Evaluation (Deleted)
 Anesthesia Post Note  Patient: Ashley Spears  Procedure(s) Performed: COLONOSCOPY  Patient location during evaluation: Endoscopy Anesthesia Type: General Level of consciousness: awake and alert Pain management: pain level controlled Vital Signs Assessment: post-procedure vital signs reviewed and stable Respiratory status: spontaneous breathing, nonlabored ventilation and respiratory function stable Cardiovascular status: blood pressure returned to baseline and stable Postop Assessment: no apparent nausea or vomiting Anesthetic complications: no   No notable events documented.   Last Vitals:  Vitals:   08/13/24 0845  BP: (!) 185/100  Pulse: 83  Resp: 11  Temp: 36.7 C  SpO2: 100%    Last Pain:  Vitals:   08/13/24 0845  TempSrc: Temporal  PainSc: 0-No pain                 Fairy POUR Jenasis Straley
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
## 2024-08-13 NOTE — H&P (Addendum)
 Procedure cancelled due to inadequate bowel prep  RV

## 2024-08-14 ENCOUNTER — Encounter: Payer: Self-pay | Admitting: Gastroenterology

## 2024-08-14 ENCOUNTER — Ambulatory Visit: Admitting: Anesthesiology

## 2024-08-14 ENCOUNTER — Encounter
Admission: RE | Disposition: A | Discharge status: Home / Self Care | Payer: Self-pay | Source: Home / Self Care | Attending: Gastroenterology

## 2024-08-14 ENCOUNTER — Ambulatory Visit
Admission: RE | Admit: 2024-08-14 | Discharge: 2024-08-14 | Disposition: A | Discharge status: Home / Self Care | Attending: Gastroenterology | Admitting: Gastroenterology

## 2024-08-14 DIAGNOSIS — Z8249 Family history of ischemic heart disease and other diseases of the circulatory system: Secondary | ICD-10-CM | POA: Diagnosis not present

## 2024-08-14 DIAGNOSIS — Z5986 Financial insecurity: Secondary | ICD-10-CM | POA: Insufficient documentation

## 2024-08-14 DIAGNOSIS — Z5941 Food insecurity: Secondary | ICD-10-CM | POA: Insufficient documentation

## 2024-08-14 DIAGNOSIS — Z7984 Long term (current) use of oral hypoglycemic drugs: Secondary | ICD-10-CM | POA: Insufficient documentation

## 2024-08-14 DIAGNOSIS — Z1211 Encounter for screening for malignant neoplasm of colon: Secondary | ICD-10-CM | POA: Diagnosis present

## 2024-08-14 DIAGNOSIS — Z79899 Other long term (current) drug therapy: Secondary | ICD-10-CM | POA: Insufficient documentation

## 2024-08-14 DIAGNOSIS — Z7985 Long-term (current) use of injectable non-insulin antidiabetic drugs: Secondary | ICD-10-CM | POA: Insufficient documentation

## 2024-08-14 DIAGNOSIS — Z8673 Personal history of transient ischemic attack (TIA), and cerebral infarction without residual deficits: Secondary | ICD-10-CM | POA: Insufficient documentation

## 2024-08-14 DIAGNOSIS — I10 Essential (primary) hypertension: Secondary | ICD-10-CM | POA: Insufficient documentation

## 2024-08-14 DIAGNOSIS — E119 Type 2 diabetes mellitus without complications: Secondary | ICD-10-CM | POA: Insufficient documentation

## 2024-08-14 HISTORY — PX: COLONOSCOPY: SHX5424

## 2024-08-14 SURGERY — COLONOSCOPY
Anesthesia: General

## 2024-08-14 MED ORDER — DEXMEDETOMIDINE HCL IN NACL 80 MCG/20ML IV SOLN
INTRAVENOUS | Status: DC | PRN
  Administered 2024-08-14: 12 ug via INTRAVENOUS
  Administered 2024-08-14: 8 ug via INTRAVENOUS

## 2024-08-14 MED ORDER — EPHEDRINE 5 MG/ML INJ
INTRAVENOUS | Status: AC
  Filled 2024-08-14: qty 5

## 2024-08-14 MED ORDER — PROPOFOL 1000 MG/100ML IV EMUL
INTRAVENOUS | Status: AC
  Filled 2024-08-14: qty 100

## 2024-08-14 MED ORDER — PROPOFOL 10 MG/ML IV BOLUS
INTRAVENOUS | Status: DC | PRN
  Administered 2024-08-14: 20 mg via INTRAVENOUS
  Administered 2024-08-14: 40 mg via INTRAVENOUS

## 2024-08-14 MED ORDER — PROPOFOL 500 MG/50ML IV EMUL
INTRAVENOUS | Status: DC | PRN
  Administered 2024-08-14: 75 ug/kg/min via INTRAVENOUS

## 2024-08-14 MED ORDER — SODIUM CHLORIDE 0.9 % IV SOLN
INTRAVENOUS | Status: DC
  Administered 2024-08-14: 500 mL via INTRAVENOUS

## 2024-08-14 MED ORDER — LIDOCAINE HCL (PF) 2 % IJ SOLN
INTRAMUSCULAR | Status: AC
  Filled 2024-08-14: qty 5

## 2024-08-14 MED ORDER — LIDOCAINE HCL (CARDIAC) PF 100 MG/5ML IV SOSY
PREFILLED_SYRINGE | INTRAVENOUS | Status: DC | PRN
  Administered 2024-08-14: 60 mg via INTRAVENOUS

## 2024-08-14 NOTE — Op Note (Signed)
 [facility]enterology Patient Name: Ashley Spears Procedure Date: 08/14/2024 10:34 AM MRN: [id number] Account #: [id number] Date of Birth: 03/08/1961 Admit Type: Outpatient Age: 63 Room: [facility] ENDO ROOM 4 Gender: Female Note Status: Finalized Instrument Name: Peds Colonoscope [id number] Procedure:             Colonoscopy Indications:           Screening for colorectal malignant neoplasm, Last                         colonoscopy 10 years ago Providers:             Corinn Jess Brooklyn MD, MD Referring MD:          Sionne A. Zachary MD, MD (Referring MD) Medicines:             General Anesthesia Complications:         No immediate complications. Estimated blood loss: None. Procedure:             Pre-Anesthesia Assessment:                        - Prior to the procedure, a History and Physical was                         performed, and patient medications and allergies were                         reviewed. The patient is competent. The risks and                         benefits of the procedure and the sedation options and                         risks were discussed with the patient. All questions                         were answered and informed consent was obtained.                         Patient identification and proposed procedure were                         verified by the physician, the nurse, the                         anesthesiologist, the anesthetist and the technician                         in the pre-procedure area in the procedure room in the                         endoscopy suite. Mental Status Examination: alert and                         oriented. Airway Examination: normal oropharyngeal                         airway and neck mobility. Respiratory Examination:  clear to auscultation. CV Examination: normal.                         Prophylactic Antibiotics: The patient does not require                          prophylactic antibiotics. Prior Anticoagulants: The                         patient has taken no anticoagulant or antiplatelet                         agents. ASA Grade Assessment: III - A patient with                         severe systemic disease. After reviewing the risks and                         benefits, the patient was deemed in satisfactory                         condition to undergo the procedure. The anesthesia                         plan was to use general anesthesia. Immediately prior                         to administration of medications, the patient was                         re-assessed for adequacy to receive sedatives. The                         heart rate, respiratory rate, oxygen saturations,                         blood pressure, adequacy of pulmonary ventilation, and                         response to care were monitored throughout the                         procedure. The physical status of the patient was                         re-assessed after the procedure.                        After obtaining informed consent, the colonoscope was                         passed under direct vision. Throughout the procedure,                         the patient's blood pressure, pulse, and oxygen                         saturations were monitored continuously. The  Colonoscope was introduced through the anus and                         advanced to the the cecum, identified by appendiceal                         orifice and ileocecal valve. The colonoscopy was                         performed without difficulty. The patient tolerated                         the procedure well. The quality of the bowel                         preparation was evaluated using the BBPS [facility] Bowel                         Preparation Scale) with scores of: Right Colon = 3,                         Transverse Colon = 3 and Left Colon = 3 (entire mucosa                          seen well with no residual staining, small fragments                         of stool or opaque liquid). The total BBPS score                         equals 9. The ileocecal valve, appendiceal orifice,                         and rectum were photographed. Findings:      The perianal and digital rectal examinations were normal. Pertinent       negatives include normal sphincter tone and no palpable rectal lesions.      The entire examined colon appeared normal.      The retroflexed view of the distal rectum and anal verge was normal and       showed no anal or rectal abnormalities. Impression:            - The entire examined colon is normal.                        - The distal rectum and anal verge are normal on                         retroflexion view.                        - No specimens collected. Recommendation:        - Discharge patient to home (with escort).                        - Resume previous diet today.                        -  Continue present medications.                        - Repeat colonoscopy in 10 years for screening                         purposes. Procedure Code(s):     --- Professional ---                        H9878, Colorectal cancer screening; colonoscopy on                         individual not meeting criteria for high risk Diagnosis Code(s):     --- Professional ---                        Z12.11, Encounter for screening for malignant neoplasm                         of colon CPT copyright 2022 American Medical Association. All rights reserved. The codes documented in this report are preliminary and upon coder review may  be revised to meet current compliance requirements. Dr. Corinn Brooklyn Corinn Jess Brooklyn MD, MD 08/14/2024 11:45:27 AM This report has been signed electronically. Number of Addenda: 0 Note Initiated On: 08/14/2024 10:34 AM Scope Withdrawal Time: 0 hours 11 minutes 56 seconds  Total Procedure Duration: 0 hours 20 minutes 45  seconds  Estimated Blood Loss:  Estimated blood loss: none.      [facility]

## 2024-08-14 NOTE — Anesthesia Preprocedure Evaluation (Signed)
 Anesthesia Evaluation  Patient identified by MRN, date of birth, ID band Patient awake    Reviewed: Allergy & Precautions, H&P , NPO status , Patient's Chart, lab work & pertinent test results, reviewed documented beta blocker date and time   Airway Mallampati: II   Neck ROM: full    Dental  (+) Poor Dentition   Pulmonary neg pulmonary ROS   Pulmonary exam normal        Cardiovascular Exercise Tolerance: Good hypertension, On Medications negative cardio ROS Normal cardiovascular exam Rhythm:regular Rate:Normal     Neuro/Psych TIA negative psych ROS   GI/Hepatic negative GI ROS, Neg liver ROS,,,  Endo/Other  negative endocrine ROSdiabetes, Well Controlled    Renal/GU negative Renal ROS  negative genitourinary   Musculoskeletal   Abdominal   Peds  Hematology negative hematology ROS (+)   Anesthesia Other Findings Past Medical History: No date: Diabetes mellitus without complication (HCC) No date: Hypertension Past Surgical History: No date: CESAREAN SECTION No date: OOPHORECTOMY BMI    Body Mass Index: 28.64 kg/m     Reproductive/Obstetrics negative OB ROS                              Anesthesia Physical Anesthesia Plan  ASA: 3  Anesthesia Plan: General   Post-op Pain Management:    Induction:   PONV Risk Score and Plan:   Airway Management Planned:   Additional Equipment:   Intra-op Plan:   Post-operative Plan:   Informed Consent: I have reviewed the patients History and Physical, chart, labs and discussed the procedure including the risks, benefits and alternatives for the proposed anesthesia with the patient or authorized representative who has indicated his/her understanding and acceptance.     Dental Advisory Given  Plan Discussed with: CRNA  Anesthesia Plan Comments:         Anesthesia Quick Evaluation

## 2024-08-14 NOTE — H&P (Signed)
 [name], MD [facility]enter[facility], DHIP [address]  [place], [place] [address]  Main: [phone] Fax:  [phone] Pager: [phone]   Primary Care Physician:  Zachary Idelia LABOR, MD Primary Gastroenterologist:  Dr. [name]  Pre-Procedure History & Physical: HPI:  Ashley Spears is a 63 y.o. female is here for an colonoscopy.   Past Medical History:  Diagnosis Date   Diabetes mellitus without complication (HCC)    Hypertension     Past Surgical History:  Procedure Laterality Date   CESAREAN SECTION     OOPHORECTOMY      Prior to Admission medications   Medication Sig Start Date End Date Taking? Authorizing Provider  amLODipine (NORVASC) 10 MG tablet Take 10 mg by mouth daily.   Yes [provider]  atorvastatin  (LIPITOR) 20 MG tablet Take 1 tablet (20 mg total) by mouth daily. 09/24/15  Yes Konidena, Snehalatha, MD  glipiZIDE (GLUCOTROL XL) 10 MG 24 hr tablet Take 1 tablet by mouth 2 (two) times daily. 09/25/15  Yes [provider]  lisinopril -hydrochlorothiazide  (PRINZIDE,ZESTORETIC) 20-25 MG tablet Take 1 tablet by mouth daily.   Yes [provider]  Semaglutide 4 MG/3ML SOCT Inject into the skin.    [provider]  Semaglutide-Weight Management (WEGOVY) 0.25 MG/0.5ML SOAJ Inject 0.25 mg into the skin once a week. Patient not taking: Reported on 08/13/2024    [provider]    Allergies as of 08/13/2024   (No Known Allergies)    Family History  Problem Relation Age of Onset   Breast cancer Maternal Aunt    Hypertension Other     Social History   Socioeconomic History   Marital status: Widowed    Spouse name: Not on file   Number of children: Not on file   Years of education: Not on file   Highest education level: Not on file  Occupational History   Not on file  Tobacco Use   Smoking status: Never   Smokeless tobacco: Never  Vaping Use   Vaping status: Never Used   Substance and Sexual Activity   Alcohol use: No   Drug use: Never   Sexual activity: Not on file  Other Topics Concern   Not on file  Social History Narrative   Not on file   Social Drivers of Health   Financial Resource Strain: High Risk (06/06/2024)   Received from [facility]   Overall Financial Resource Strain (CARDIA)    Difficulty of Paying Living Expenses: Hard  Food Insecurity: Food Insecurity Present (06/06/2024)   Received from [facility]   Hunger Vital Sign    Within the past 12 months, you worried that your food would run out before you got the money to buy more.: Sometimes true    Within the past 12 months, the food you bought just didn't last and you didn't have money to get more.: Sometimes true  Transportation Needs: No Transportation Needs (06/06/2024)   Received from [facility] - Transportation    In the past 12 months, has lack of transportation kept you from medical appointments or from getting medications?: No    Lack of Transportation (Non-Medical): No  Physical Activity: Inactive (03/08/2024)   Received from [facility]   Exercise Vital Sign    On average, how many days per week do you engage in moderate to strenuous exercise (like a brisk walk)?: 0 days  On average, how many minutes do you engage in exercise at this level?: 0 min  Stress: Stress Concern Present (03/08/2024)   Received from [facility] - Occupational Stress Questionnaire    Feeling of Stress : Very much  Social Connections: Moderately Isolated (03/08/2024)   Received from [facility]   Social Connection and Isolation Panel    In a typical week, how many times do you talk on the phone with family, friends, or neighbors?: More than three times a week    How often do you get together with friends or relatives?: More than three times a  week    How often do you attend church or religious services?: More than 4 times per year    Do you belong to any clubs or organizations such as church groups, unions, fraternal or athletic groups, or school groups?: No    How often do you attend meetings of the clubs or organizations you belong to?: Never    Are you married, widowed, divorced, separated, never married, or living with a partner?: Widowed  Intimate Partner Violence: Not At Risk (05/20/2019)   Received from [facility]   Humiliation, Afraid, Rape, and Kick questionnaire    Within the last year, have you been afraid of your partner or ex-partner?: No    Within the last year, have you been humiliated or emotionally abused in other ways by your partner or ex-partner?: No    Within the last year, have you been kicked, hit, slapped, or otherwise physically hurt by your partner or ex-partner?: No    Within the last year, have you been raped or forced to have any kind of sexual activity by your partner or ex-partner?: No    Review of Systems: See HPI, otherwise negative ROS  Physical Exam: BP (!) 152/83   Pulse 90   Temp (!) 97.1 F (36.2 C) (Temporal)   Resp 16   Ht 5' 2 (1.575 m)   Wt 71 kg   LMP 01/13/2018 Comment: pt states not pregnant sporadic LMP  SpO2 100%   BMI 28.64 kg/m  General:   Alert,  pleasant and cooperative in NAD Head:  Normocephalic and atraumatic. Neck:  Supple; no masses or thyromegaly. Lungs:  Clear throughout to auscultation.    Heart:  Regular rate and rhythm. Abdomen:  Soft, nontender and nondistended. Normal bowel sounds, without guarding, and without rebound.   Neurologic:  Alert and  oriented x4;  grossly normal neurologically.  Impression/Plan: Ronal SQUIBB Bible is here for an colonoscopy to be performed for colon cancer screening  Risks, benefits, limitations, and alternatives regarding  colonoscopy have been reviewed with the patient.  Questions have been answered.  All parties  agreeable.   Corinn Brooklyn, MD  08/14/2024, 10:33 AM

## 2024-08-14 NOTE — Transfer of Care (Signed)
 Immediate Anesthesia Transfer of Care Note  Patient: Ashley Spears  Procedure(s) Performed: COLONOSCOPY  Patient Location: PACU  Anesthesia Type:General  Level of Consciousness: sedated  Airway & Oxygen Therapy: Patient Spontanous Breathing  Post-op Assessment: Report given to RN and Post -op Vital signs reviewed and stable  Post vital signs: Reviewed and stable  Last Vitals:  Vitals Value Taken Time  BP 113/64 08/14/24 11:50  Temp 36.8 C 08/14/24 11:48  Pulse 72 08/14/24 11:51  Resp 18 08/14/24 11:51  SpO2 100 % 08/14/24 11:51  Vitals shown include unfiled device data.  Last Pain:  Vitals:   08/14/24 1148  TempSrc: Temporal  PainSc: Asleep         Complications: No notable events documented.

## 2024-08-19 NOTE — Anesthesia Postprocedure Evaluation (Signed)
 Anesthesia Post Note  Patient: Ashley Spears  Procedure(s) Performed: COLONOSCOPY  Patient location during evaluation: PACU Anesthesia Type: General Level of consciousness: awake and alert Pain management: pain level controlled Vital Signs Assessment: post-procedure vital signs reviewed and stable Respiratory status: spontaneous breathing, nonlabored ventilation, respiratory function stable and patient connected to nasal cannula oxygen Cardiovascular status: blood pressure returned to baseline and stable Postop Assessment: no apparent nausea or vomiting Anesthetic complications: no   No notable events documented.   Last Vitals:  Vitals:   08/14/24 1148 08/14/24 1208  BP: 113/64   Pulse: 73 75  Resp: 20   Temp: 36.8 C   SpO2: 100% 100%    Last Pain:  Vitals:   08/14/24 1208  TempSrc:   PainSc: 0-No pain                 Lynwood KANDICE Clause
